# Patient Record
Sex: Female | Born: 1945 | Race: White | Hispanic: No | Marital: Single | Smoking: Former smoker
Health system: Southern US, Community
[De-identification: ages and names within clinical notes are randomized; demographics above are authoritative.]

## PROBLEM LIST (undated history)

## (undated) DIAGNOSIS — F32A Depression, unspecified: Secondary | ICD-10-CM

## (undated) DIAGNOSIS — G4733 Obstructive sleep apnea (adult) (pediatric): Secondary | ICD-10-CM

## (undated) DIAGNOSIS — I10 Essential (primary) hypertension: Secondary | ICD-10-CM

## (undated) DIAGNOSIS — F329 Major depressive disorder, single episode, unspecified: Secondary | ICD-10-CM

## (undated) DIAGNOSIS — Z79890 Hormone replacement therapy (postmenopausal): Secondary | ICD-10-CM

## (undated) DIAGNOSIS — E119 Type 2 diabetes mellitus without complications: Secondary | ICD-10-CM

## (undated) HISTORY — DX: Depression, unspecified: F32.A

## (undated) HISTORY — DX: Major depressive disorder, single episode, unspecified: F32.9

## (undated) HISTORY — DX: Hormone replacement therapy: Z79.890

## (undated) HISTORY — DX: Essential (primary) hypertension: I10

## (undated) HISTORY — DX: Obstructive sleep apnea (adult) (pediatric): G47.33

## (undated) HISTORY — DX: Type 2 diabetes mellitus without complications: E11.9

---

## 1999-01-24 ENCOUNTER — Other Ambulatory Visit: Admission: RE | Admit: 1999-01-24 | Discharge: 1999-01-24 | Payer: Self-pay | Admitting: Obstetrics and Gynecology

## 2000-02-22 ENCOUNTER — Other Ambulatory Visit: Admission: RE | Admit: 2000-02-22 | Discharge: 2000-02-22 | Payer: Self-pay | Admitting: Obstetrics and Gynecology

## 2001-02-22 ENCOUNTER — Other Ambulatory Visit: Admission: RE | Admit: 2001-02-22 | Discharge: 2001-02-22 | Payer: Self-pay | Admitting: Obstetrics and Gynecology

## 2002-02-25 ENCOUNTER — Other Ambulatory Visit: Admission: RE | Admit: 2002-02-25 | Discharge: 2002-02-25 | Payer: Self-pay | Admitting: Obstetrics and Gynecology

## 2003-02-27 ENCOUNTER — Other Ambulatory Visit: Admission: RE | Admit: 2003-02-27 | Discharge: 2003-02-27 | Payer: Self-pay | Admitting: Obstetrics and Gynecology

## 2004-03-02 ENCOUNTER — Other Ambulatory Visit: Admission: RE | Admit: 2004-03-02 | Discharge: 2004-03-02 | Payer: Self-pay | Admitting: Obstetrics and Gynecology

## 2005-03-06 ENCOUNTER — Other Ambulatory Visit: Admission: RE | Admit: 2005-03-06 | Discharge: 2005-03-06 | Payer: Self-pay | Admitting: Obstetrics and Gynecology

## 2006-03-13 ENCOUNTER — Other Ambulatory Visit: Admission: RE | Admit: 2006-03-13 | Discharge: 2006-03-13 | Payer: Self-pay | Admitting: Obstetrics and Gynecology

## 2007-03-19 ENCOUNTER — Other Ambulatory Visit: Admission: RE | Admit: 2007-03-19 | Discharge: 2007-03-19 | Payer: Self-pay | Admitting: Obstetrics and Gynecology

## 2008-03-19 ENCOUNTER — Other Ambulatory Visit: Admission: RE | Admit: 2008-03-19 | Discharge: 2008-03-19 | Payer: Self-pay | Admitting: Obstetrics and Gynecology

## 2009-04-20 ENCOUNTER — Encounter: Payer: Self-pay | Admitting: Obstetrics and Gynecology

## 2009-04-20 ENCOUNTER — Other Ambulatory Visit: Admission: RE | Admit: 2009-04-20 | Discharge: 2009-04-20 | Payer: Self-pay | Admitting: Obstetrics and Gynecology

## 2009-04-20 ENCOUNTER — Ambulatory Visit: Payer: Self-pay | Admitting: Obstetrics and Gynecology

## 2009-08-11 ENCOUNTER — Encounter: Admission: RE | Admit: 2009-08-11 | Discharge: 2009-08-11 | Payer: Self-pay | Admitting: Family Medicine

## 2010-04-24 DIAGNOSIS — E119 Type 2 diabetes mellitus without complications: Secondary | ICD-10-CM

## 2010-04-24 HISTORY — DX: Type 2 diabetes mellitus without complications: E11.9

## 2010-05-04 ENCOUNTER — Ambulatory Visit: Payer: Self-pay | Admitting: Obstetrics and Gynecology

## 2010-05-04 ENCOUNTER — Other Ambulatory Visit: Admission: RE | Admit: 2010-05-04 | Discharge: 2010-05-04 | Payer: Self-pay | Admitting: Obstetrics and Gynecology

## 2011-05-24 ENCOUNTER — Encounter (INDEPENDENT_AMBULATORY_CARE_PROVIDER_SITE_OTHER): Payer: Medicare Other | Admitting: Obstetrics and Gynecology

## 2011-05-24 DIAGNOSIS — N951 Menopausal and female climacteric states: Secondary | ICD-10-CM

## 2011-05-24 DIAGNOSIS — N952 Postmenopausal atrophic vaginitis: Secondary | ICD-10-CM

## 2011-05-24 DIAGNOSIS — R350 Frequency of micturition: Secondary | ICD-10-CM

## 2011-07-18 ENCOUNTER — Encounter: Payer: Self-pay | Admitting: Obstetrics and Gynecology

## 2012-01-08 DIAGNOSIS — E669 Obesity, unspecified: Secondary | ICD-10-CM | POA: Diagnosis not present

## 2012-01-08 DIAGNOSIS — G4733 Obstructive sleep apnea (adult) (pediatric): Secondary | ICD-10-CM | POA: Diagnosis not present

## 2012-01-08 DIAGNOSIS — I10 Essential (primary) hypertension: Secondary | ICD-10-CM | POA: Diagnosis not present

## 2012-01-22 DIAGNOSIS — E7800 Pure hypercholesterolemia, unspecified: Secondary | ICD-10-CM | POA: Diagnosis not present

## 2012-01-22 DIAGNOSIS — I10 Essential (primary) hypertension: Secondary | ICD-10-CM | POA: Diagnosis not present

## 2012-02-07 ENCOUNTER — Other Ambulatory Visit: Payer: Self-pay | Admitting: Family Medicine

## 2012-02-07 DIAGNOSIS — D2330 Other benign neoplasm of skin of unspecified part of face: Secondary | ICD-10-CM | POA: Diagnosis not present

## 2012-04-22 DIAGNOSIS — E7800 Pure hypercholesterolemia, unspecified: Secondary | ICD-10-CM | POA: Diagnosis not present

## 2012-07-10 DIAGNOSIS — I10 Essential (primary) hypertension: Secondary | ICD-10-CM | POA: Diagnosis not present

## 2012-07-10 DIAGNOSIS — E669 Obesity, unspecified: Secondary | ICD-10-CM | POA: Diagnosis not present

## 2012-07-10 DIAGNOSIS — G4733 Obstructive sleep apnea (adult) (pediatric): Secondary | ICD-10-CM | POA: Diagnosis not present

## 2012-08-12 DIAGNOSIS — Z23 Encounter for immunization: Secondary | ICD-10-CM | POA: Diagnosis not present

## 2012-08-12 DIAGNOSIS — G4733 Obstructive sleep apnea (adult) (pediatric): Secondary | ICD-10-CM | POA: Diagnosis not present

## 2012-08-12 DIAGNOSIS — E7800 Pure hypercholesterolemia, unspecified: Secondary | ICD-10-CM | POA: Diagnosis not present

## 2012-08-12 DIAGNOSIS — F325 Major depressive disorder, single episode, in full remission: Secondary | ICD-10-CM | POA: Diagnosis not present

## 2012-08-12 DIAGNOSIS — I10 Essential (primary) hypertension: Secondary | ICD-10-CM | POA: Diagnosis not present

## 2012-08-12 DIAGNOSIS — Z0000 Encounter for general adult medical examination without abnormal findings: Secondary | ICD-10-CM | POA: Diagnosis not present

## 2012-08-12 DIAGNOSIS — J301 Allergic rhinitis due to pollen: Secondary | ICD-10-CM | POA: Diagnosis not present

## 2012-09-25 ENCOUNTER — Encounter: Payer: Self-pay | Admitting: Obstetrics and Gynecology

## 2012-09-25 DIAGNOSIS — Z1231 Encounter for screening mammogram for malignant neoplasm of breast: Secondary | ICD-10-CM | POA: Diagnosis not present

## 2012-09-25 DIAGNOSIS — Z13820 Encounter for screening for osteoporosis: Secondary | ICD-10-CM | POA: Diagnosis not present

## 2012-09-25 DIAGNOSIS — N951 Menopausal and female climacteric states: Secondary | ICD-10-CM | POA: Diagnosis not present

## 2012-10-08 DIAGNOSIS — Z23 Encounter for immunization: Secondary | ICD-10-CM | POA: Diagnosis not present

## 2012-10-08 DIAGNOSIS — M25569 Pain in unspecified knee: Secondary | ICD-10-CM | POA: Diagnosis not present

## 2012-10-11 DIAGNOSIS — M25569 Pain in unspecified knee: Secondary | ICD-10-CM | POA: Diagnosis not present

## 2012-10-11 DIAGNOSIS — M25469 Effusion, unspecified knee: Secondary | ICD-10-CM | POA: Diagnosis not present

## 2013-02-12 DIAGNOSIS — E669 Obesity, unspecified: Secondary | ICD-10-CM | POA: Diagnosis not present

## 2013-02-12 DIAGNOSIS — F325 Major depressive disorder, single episode, in full remission: Secondary | ICD-10-CM | POA: Diagnosis not present

## 2013-02-12 DIAGNOSIS — I10 Essential (primary) hypertension: Secondary | ICD-10-CM | POA: Diagnosis not present

## 2013-02-12 DIAGNOSIS — G4733 Obstructive sleep apnea (adult) (pediatric): Secondary | ICD-10-CM | POA: Diagnosis not present

## 2013-02-12 DIAGNOSIS — E7800 Pure hypercholesterolemia, unspecified: Secondary | ICD-10-CM | POA: Diagnosis not present

## 2013-06-09 DIAGNOSIS — E119 Type 2 diabetes mellitus without complications: Secondary | ICD-10-CM | POA: Diagnosis not present

## 2013-06-09 DIAGNOSIS — E7800 Pure hypercholesterolemia, unspecified: Secondary | ICD-10-CM | POA: Diagnosis not present

## 2013-08-26 DIAGNOSIS — G4733 Obstructive sleep apnea (adult) (pediatric): Secondary | ICD-10-CM | POA: Diagnosis not present

## 2013-08-26 DIAGNOSIS — E669 Obesity, unspecified: Secondary | ICD-10-CM | POA: Diagnosis not present

## 2013-08-26 DIAGNOSIS — I10 Essential (primary) hypertension: Secondary | ICD-10-CM | POA: Diagnosis not present

## 2013-10-21 DIAGNOSIS — E669 Obesity, unspecified: Secondary | ICD-10-CM | POA: Diagnosis not present

## 2013-10-21 DIAGNOSIS — I10 Essential (primary) hypertension: Secondary | ICD-10-CM | POA: Diagnosis not present

## 2013-10-21 DIAGNOSIS — IMO0002 Reserved for concepts with insufficient information to code with codable children: Secondary | ICD-10-CM | POA: Diagnosis not present

## 2013-10-21 DIAGNOSIS — G4733 Obstructive sleep apnea (adult) (pediatric): Secondary | ICD-10-CM | POA: Diagnosis not present

## 2013-10-21 DIAGNOSIS — Z23 Encounter for immunization: Secondary | ICD-10-CM | POA: Diagnosis not present

## 2013-10-21 DIAGNOSIS — Z0000 Encounter for general adult medical examination without abnormal findings: Secondary | ICD-10-CM | POA: Diagnosis not present

## 2013-10-21 DIAGNOSIS — Z1231 Encounter for screening mammogram for malignant neoplasm of breast: Secondary | ICD-10-CM | POA: Diagnosis not present

## 2013-10-21 DIAGNOSIS — E7800 Pure hypercholesterolemia, unspecified: Secondary | ICD-10-CM | POA: Diagnosis not present

## 2013-10-21 DIAGNOSIS — J301 Allergic rhinitis due to pollen: Secondary | ICD-10-CM | POA: Diagnosis not present

## 2013-12-24 DIAGNOSIS — H02839 Dermatochalasis of unspecified eye, unspecified eyelid: Secondary | ICD-10-CM | POA: Diagnosis not present

## 2013-12-24 DIAGNOSIS — H04129 Dry eye syndrome of unspecified lacrimal gland: Secondary | ICD-10-CM | POA: Diagnosis not present

## 2013-12-24 DIAGNOSIS — H02429 Myogenic ptosis of unspecified eyelid: Secondary | ICD-10-CM | POA: Diagnosis not present

## 2013-12-24 DIAGNOSIS — H5340 Unspecified visual field defects: Secondary | ICD-10-CM | POA: Diagnosis not present

## 2013-12-24 DIAGNOSIS — H02409 Unspecified ptosis of unspecified eyelid: Secondary | ICD-10-CM | POA: Diagnosis not present

## 2014-02-16 DIAGNOSIS — H02839 Dermatochalasis of unspecified eye, unspecified eyelid: Secondary | ICD-10-CM | POA: Diagnosis not present

## 2014-02-16 DIAGNOSIS — H02429 Myogenic ptosis of unspecified eyelid: Secondary | ICD-10-CM | POA: Diagnosis not present

## 2014-02-16 DIAGNOSIS — Z79899 Other long term (current) drug therapy: Secondary | ICD-10-CM | POA: Diagnosis not present

## 2014-02-16 DIAGNOSIS — E119 Type 2 diabetes mellitus without complications: Secondary | ICD-10-CM | POA: Diagnosis not present

## 2014-02-16 DIAGNOSIS — IMO0002 Reserved for concepts with insufficient information to code with codable children: Secondary | ICD-10-CM | POA: Diagnosis not present

## 2014-02-16 DIAGNOSIS — H02409 Unspecified ptosis of unspecified eyelid: Secondary | ICD-10-CM | POA: Diagnosis not present

## 2014-02-16 DIAGNOSIS — Z7982 Long term (current) use of aspirin: Secondary | ICD-10-CM | POA: Diagnosis not present

## 2014-02-16 DIAGNOSIS — I10 Essential (primary) hypertension: Secondary | ICD-10-CM | POA: Diagnosis not present

## 2014-02-16 DIAGNOSIS — H04129 Dry eye syndrome of unspecified lacrimal gland: Secondary | ICD-10-CM | POA: Diagnosis not present

## 2014-02-16 DIAGNOSIS — L918 Other hypertrophic disorders of the skin: Secondary | ICD-10-CM | POA: Diagnosis not present

## 2014-02-16 DIAGNOSIS — H539 Unspecified visual disturbance: Secondary | ICD-10-CM | POA: Diagnosis not present

## 2014-02-16 DIAGNOSIS — L908 Other atrophic disorders of skin: Secondary | ICD-10-CM | POA: Diagnosis not present

## 2014-02-16 DIAGNOSIS — H5340 Unspecified visual field defects: Secondary | ICD-10-CM | POA: Diagnosis not present

## 2014-03-02 ENCOUNTER — Encounter: Payer: Self-pay | Admitting: General Surgery

## 2014-03-02 DIAGNOSIS — G4733 Obstructive sleep apnea (adult) (pediatric): Secondary | ICD-10-CM | POA: Insufficient documentation

## 2014-03-02 DIAGNOSIS — I10 Essential (primary) hypertension: Secondary | ICD-10-CM

## 2014-03-10 ENCOUNTER — Ambulatory Visit (INDEPENDENT_AMBULATORY_CARE_PROVIDER_SITE_OTHER): Payer: Medicare Other | Admitting: Cardiology

## 2014-03-10 ENCOUNTER — Encounter: Payer: Self-pay | Admitting: Cardiology

## 2014-03-10 VITALS — BP 144/83 | HR 67 | Ht 66.0 in | Wt 227.0 lb

## 2014-03-10 DIAGNOSIS — I10 Essential (primary) hypertension: Secondary | ICD-10-CM | POA: Diagnosis not present

## 2014-03-10 DIAGNOSIS — E669 Obesity, unspecified: Secondary | ICD-10-CM

## 2014-03-10 DIAGNOSIS — G4733 Obstructive sleep apnea (adult) (pediatric): Secondary | ICD-10-CM | POA: Diagnosis not present

## 2014-03-10 NOTE — Patient Instructions (Addendum)
Your physician recommends that you continue on your current medications as directed. Please refer to the Current Medication list given to you today.  We are going to have a 2 Week AutoTitration done with [facility] for you.  Your physician wants you to follow-up in: 6 months with Dr Mallie Snooks will receive a reminder letter in the mail two months in advance. If you don't receive a letter, please call our office to schedule the follow-up appointment.

## 2014-03-10 NOTE — Progress Notes (Signed)
  [place], [place], [place]  [address] Phone: [phone] Fax:  [phone]  Date:  03/10/2014   ID:  Angela, Heath May 25, 1946, MRN [id number]  PCP:  Osborne Casco, MD  Sleep Medicine:    Fransico Him, MD   History of Present Illness: Angela Heath is a 68 y.o. female with a history of OSA, HTN and obesity who presents today for followup.  She is doing well.  She tolerates her CPAP device and feels the pressure is adequate.  She uses a nasal mask with chin strap and tolerates it well.   Wt Readings from Last 3 Encounters:  03/10/14 227 lb (102.967 kg)     Past Medical History  Diagnosis Date  . Diabetes 04/2010  . Hypertension   . Hormone replacement therapy   . OSA (obstructive sleep apnea)   . Depression     with anxious features    Current Outpatient Prescriptions  Medication Sig Dispense Refill  . aspirin EC 81 MG tablet Take 81 mg by mouth daily.      . citalopram (CELEXA) 10 MG tablet Take 10 mg by mouth daily.      Marland Kitchen glimepiride (AMARYL) 4 MG tablet Take 4 mg by mouth daily with breakfast.      . losartan-hydrochlorothiazide (HYZAAR) 100-12.5 MG per tablet Take 1 tablet by mouth daily.      . metFORMIN (GLUCOPHAGE) 1000 MG tablet Take 1,000 mg by mouth daily with breakfast.      . montelukast (SINGULAIR) 10 MG tablet Take 10 mg by mouth at bedtime.      . Omega-3 Fatty Acids (FISH OIL) 1000 MG CAPS Take 1 capsule by mouth 2 (two) times daily.       No current facility-administered medications for this visit.    Allergies:   No Known Allergies  Social History:  The patient  reports that she quit smoking about 19 years ago. She does not have any smokeless tobacco history on file. She reports that she does not drink alcohol or use illicit drugs.   Family History:  The patient's family history includes CVA in her father; Diabetes in her mother; Hypertension in her father.   ROS:  Please see the history of present illness.       All other systems reviewed and negative.   PHYSICAL EXAM: VS:  BP 144/83  Pulse 67  Ht 5\' 6"  (1.676 m)  Wt 227 lb (102.967 kg)  BMI 36.66 kg/m2 Well nourished, well developed, in no acute distress HEENT: normal Neck: no JVD Cardiac:  normal S1, S2; RRR; no murmur Lungs:  clear to auscultation bilaterally, no wheezing, rhonchi or rales Abd: soft, nontender, no hepatomegaly Ext: no edema Skin: warm and dry Neuro:  CNs 2-12 intact, no focal abnormalities noted       ASSESSMENT AND PLAN:  1. OSA on CPAP and tolerating well.  Her download today showed an AHI of 7.8/hr on 10cm H2o and 84% compliance in using more than 4 hours.  I will order a 2 week autotitration from 4 to 20cm H2O given her elevated AHI. 2. HTN - controlled  - continue Hyzaar 3. Obesity - She walks on a treadmill for 45 minutes about 3 days weekly.  I encouraged her to try to get up to 5 days weekly.  Signed, Fransico Him, MD 03/10/2014 9:31 AM

## 2014-03-25 ENCOUNTER — Encounter: Payer: Self-pay | Admitting: Cardiology

## 2014-04-21 DIAGNOSIS — I10 Essential (primary) hypertension: Secondary | ICD-10-CM | POA: Diagnosis not present

## 2014-04-21 DIAGNOSIS — E7800 Pure hypercholesterolemia, unspecified: Secondary | ICD-10-CM | POA: Diagnosis not present

## 2014-04-21 DIAGNOSIS — IMO0001 Reserved for inherently not codable concepts without codable children: Secondary | ICD-10-CM | POA: Diagnosis not present

## 2014-04-21 DIAGNOSIS — IMO0002 Reserved for concepts with insufficient information to code with codable children: Secondary | ICD-10-CM | POA: Diagnosis not present

## 2014-07-21 DIAGNOSIS — IMO0001 Reserved for inherently not codable concepts without codable children: Secondary | ICD-10-CM | POA: Diagnosis not present

## 2014-09-14 ENCOUNTER — Encounter: Payer: Self-pay | Admitting: Cardiology

## 2014-09-15 ENCOUNTER — Encounter: Payer: Self-pay | Admitting: Cardiology

## 2014-09-15 ENCOUNTER — Ambulatory Visit (INDEPENDENT_AMBULATORY_CARE_PROVIDER_SITE_OTHER): Payer: Medicare Other | Admitting: Cardiology

## 2014-09-15 VITALS — BP 124/62 | HR 71 | Ht 66.0 in | Wt 223.0 lb

## 2014-09-15 DIAGNOSIS — G4733 Obstructive sleep apnea (adult) (pediatric): Secondary | ICD-10-CM

## 2014-09-15 DIAGNOSIS — E669 Obesity, unspecified: Secondary | ICD-10-CM | POA: Diagnosis not present

## 2014-09-15 DIAGNOSIS — I10 Essential (primary) hypertension: Secondary | ICD-10-CM | POA: Diagnosis not present

## 2014-09-15 NOTE — Patient Instructions (Signed)
Your physician recommends that you continue on your current medications as directed. Please refer to the Current Medication list given to you today.  Your physician wants you to follow-up in: 6 months with Dr Turner You will receive a reminder letter in the mail two months in advance. If you don't receive a letter, please call our office to schedule the follow-up appointment.  

## 2014-09-15 NOTE — Progress Notes (Signed)
[place], [place], [place]  [address] Phone: [phone] Fax:  [phone]  Date:  09/15/2014   ID:  Angela, Heath November 28, 1946, MRN [id number]  PCP:  Osborne Casco, MD  Cardiologist:  Fransico Him, MD    History of Present Illness: Angela Heath is a 68 y.o. female with a history of OSA, HTN and obesity who presents today for followup. She is doing well. She tolerates her CPAP device and feels the pressure is adequate. She uses a nasal mask with chin strap and tolerates it well. She has not had any nasal congestion or dry eyes in the am.  She feels rested when she gets up but by afternoon she gets sleepy and has to lay down. She goes to bed at 10:30pm and get up at 7am and does not wake up during the night.    Wt Readings from Last 3 Encounters:  09/15/14 223 lb (101.152 kg)  03/10/14 227 lb (102.967 kg)     Past Medical History  Diagnosis Date  . Diabetes 04/2010  . Hypertension   . Hormone replacement therapy   . OSA (obstructive sleep apnea)   . Depression     with anxious features    Current Outpatient Prescriptions  Medication Sig Dispense Refill  . aspirin EC 81 MG tablet Take 81 mg by mouth daily.      . citalopram (CELEXA) 10 MG tablet Take 10 mg by mouth daily.      Marland Kitchen glimepiride (AMARYL) 4 MG tablet Take 4 mg by mouth daily with breakfast.      . losartan-hydrochlorothiazide (HYZAAR) 100-12.5 MG per tablet Take 1 tablet by mouth daily.      . metFORMIN (GLUCOPHAGE) 1000 MG tablet Take 1,000 mg by mouth 2 (two) times daily with a meal.       . montelukast (SINGULAIR) 10 MG tablet Take 10 mg by mouth at bedtime.      . Omega-3 Fatty Acids (FISH OIL) 1000 MG CAPS Take 1 capsule by mouth 2 (two) times daily.       No current facility-administered medications for this visit.    Allergies:   No Known Allergies  Social History:  The patient  reports that she quit smoking about 19 years ago. She does not have any smokeless tobacco  history on file. She reports that she does not drink alcohol or use illicit drugs.   Family History:  The patient's family history includes CVA in her father; Diabetes in her mother; Hypertension in her father.   ROS:  Please see the history of present illness.      All other systems reviewed and negative.   PHYSICAL EXAM: VS:  BP 124/62  Pulse 71  Ht 5\' 6"  (1.676 m)  Wt 223 lb (101.152 kg)  BMI 36.01 kg/m2 Well nourished, well developed, in no acute distress HEENT: normal Neck: no JVD Cardiac:  normal S1, S2; RRR; no murmur Lungs:  clear to auscultation bilaterally, no wheezing, rhonchi or rales Abd: soft, nontender, no hepatomegaly Ext: no edema Skin: warm and dry Neuro:  CNs 2-12 intact, no focal abnormalities noted  ASSESSMENT AND PLAN:  1. OSA on CPAP and tolerating well. Her download today showed an AHI of 7/hr on 10cm H20 and 82% compliance in using more than 4 hours. She is having some daytime sleepiness and AHI is elevated.  She was supposed to have a 2 week autotitration last time I saw her but it was  not done.  I will order another one. 2. HTN - controlled - continue Hyzaar  3. Obesity - She walks on a treadmill for 45 minutes about 3 days weekly. I encouraged her to try to get up to 5 days weekly.   Signed, Fransico Him, MD [facility] 09/15/2014 9:28 AM

## 2014-09-23 ENCOUNTER — Telehealth: Payer: Self-pay | Admitting: Cardiology

## 2014-09-23 NOTE — Telephone Encounter (Signed)
New Message  Pt called to discuss having a follow up on her CPAP machine.. Has not received a call back from [facility] to adjust the pressure on the CPAP machine. Please call back to discuss.Marland Kitchen

## 2014-09-23 NOTE — Telephone Encounter (Signed)
Spoke with Art [employer] manager. The number on file they had was incorrect. I gave them the correct number and they stated they would call her today.

## 2014-10-27 DIAGNOSIS — G4733 Obstructive sleep apnea (adult) (pediatric): Secondary | ICD-10-CM | POA: Diagnosis not present

## 2014-10-27 DIAGNOSIS — E1165 Type 2 diabetes mellitus with hyperglycemia: Secondary | ICD-10-CM | POA: Diagnosis not present

## 2014-10-27 DIAGNOSIS — E780 Pure hypercholesterolemia: Secondary | ICD-10-CM | POA: Diagnosis not present

## 2014-10-27 DIAGNOSIS — Z6837 Body mass index (BMI) 37.0-37.9, adult: Secondary | ICD-10-CM | POA: Diagnosis not present

## 2014-10-27 DIAGNOSIS — F324 Major depressive disorder, single episode, in partial remission: Secondary | ICD-10-CM | POA: Diagnosis not present

## 2014-10-27 DIAGNOSIS — E669 Obesity, unspecified: Secondary | ICD-10-CM | POA: Diagnosis not present

## 2014-10-27 DIAGNOSIS — J301 Allergic rhinitis due to pollen: Secondary | ICD-10-CM | POA: Diagnosis not present

## 2014-10-27 DIAGNOSIS — Z1389 Encounter for screening for other disorder: Secondary | ICD-10-CM | POA: Diagnosis not present

## 2014-10-27 DIAGNOSIS — I10 Essential (primary) hypertension: Secondary | ICD-10-CM | POA: Diagnosis not present

## 2014-10-27 DIAGNOSIS — Z0000 Encounter for general adult medical examination without abnormal findings: Secondary | ICD-10-CM | POA: Diagnosis not present

## 2014-11-03 DIAGNOSIS — Z1231 Encounter for screening mammogram for malignant neoplasm of breast: Secondary | ICD-10-CM | POA: Diagnosis not present

## 2015-01-01 ENCOUNTER — Encounter: Payer: Self-pay | Admitting: Cardiology

## 2015-02-02 DIAGNOSIS — J329 Chronic sinusitis, unspecified: Secondary | ICD-10-CM | POA: Diagnosis not present

## 2015-03-16 NOTE — Progress Notes (Signed)
Cardiology Office Note   Date:  03/17/2015   ID:  Zahari, Fazzino 1946-09-27, MRN [id number]  PCP:  Osborne Casco, MD  Sleep Medicine:   Sueanne Margarita, MD   Chief Complaint  Patient presents with  . Sleep Apnea  . Hypertension      History of Present Illness: Angela Heath is a 69 y.o. female with a history of OSA, HTN and obesity who presents today for followup. She is doing wel but had some problems with the flu and sinus infections the past few months and has not used it much.   She tolerates her CPAP device and feels the pressure is adequate. She uses a nasal mask with chin strap and tolerates it well. She has not had any nasal congestion or dry eyes in the am. She feels rested when she gets up but by afternoon she gets sleepy and has to lay down. She walks on the treadmill 2-3 days weekly for 20-30 minutes.      Past Medical History  Diagnosis Date  . Diabetes 04/2010  . Hypertension   . Hormone replacement therapy   . OSA (obstructive sleep apnea)   . Depression     with anxious features    No past surgical history on file.   Current Outpatient Prescriptions  Medication Sig Dispense Refill  . aspirin EC 81 MG tablet Take 81 mg by mouth daily.    . citalopram (CELEXA) 20 MG tablet Take 20 mg by mouth daily.  0  . glimepiride (AMARYL) 4 MG tablet Take 4 mg by mouth daily with breakfast.    . losartan-hydrochlorothiazide (HYZAAR) 100-25 MG per tablet Take 1 tablet by mouth daily.  1  . metFORMIN (GLUCOPHAGE) 1000 MG tablet Take 1,000 mg by mouth 2 (two) times daily with a meal.     . montelukast (SINGULAIR) 10 MG tablet Take 10 mg by mouth at bedtime.    . Omega-3 Fatty Acids (FISH OIL) 1000 MG CAPS Take 1 capsule by mouth 2 (two) times daily.    . ONE TOUCH ULTRA TEST test strip daily. for testing  0  . ONETOUCH DELICA LANCETS 85I MISC   0   No current facility-administered medications for this visit.    Allergies:   Review of patient's  allergies indicates no known allergies.    Social History:  The patient  reports that she quit smoking about 20 years ago. She does not have any smokeless tobacco history on file. She reports that she does not drink alcohol or use illicit drugs.   Family History:  The patient's family history includes CVA in her father; Diabetes in her mother; Hypertension in her father.    ROS:  Please see the history of present illness.   Otherwise, review of systems are positive for none.   All other systems are reviewed and negative.    PHYSICAL EXAM: VS:  BP 112/62 mmHg  Pulse 68  Ht 5\' 6"  (1.676 m)  Wt 213 lb 1.9 oz (96.671 kg)  BMI 34.42 kg/m2 , BMI Body mass index is 34.42 kg/(m^2). GEN: Well nourished, well developed, in no acute distress HEENT: normal Neck: no JVD, carotid bruits, or masses Cardiac: RRR; no murmurs, rubs, or gallops,no edema  Respiratory:  clear to auscultation bilaterally, normal work of breathing GI: soft, nontender, nondistended, + BS MS: no deformity or atrophy Skin: warm and dry, no rash Neuro:  Strength and sensation are intact Psych: euthymic mood, full affect  EKG:  EKG is not ordered today.    Recent Labs: No results found for requested labs within last 365 days.    Lipid Panel No results found for: CHOL, TRIG, HDL, CHOLHDL, VLDL, LDLCALC, LDLDIRECT    Wt Readings from Last 3 Encounters:  03/17/15 213 lb 1.9 oz (96.671 kg)  09/15/14 223 lb (101.152 kg)  03/10/14 227 lb (102.967 kg)     ASSESSMENT AND PLAN:  1. OSA on CPAP and tolerating well. I will get a download from her DME 2. HTN - controlled - continue Hyzaar        3.   Obesity - She walks on a treadmill for 45 minutes about 3 days weekly.  Current medicines are reviewed at length with the patient today.    The patient does not have concerns regarding medicines.  The following changes have been made:  no change  Labs/ tests ordered today include:  No orders of the defined types were  placed in this encounter.     Disposition:   FU with me in 6 months   Signed, Sueanne Margarita, MD  03/17/2015 9:03 AM    [facility] [place], [place], [place]  [address] Phone: [phone]; Fax: [phone]

## 2015-03-17 ENCOUNTER — Ambulatory Visit (INDEPENDENT_AMBULATORY_CARE_PROVIDER_SITE_OTHER): Payer: Medicare Other | Admitting: Cardiology

## 2015-03-17 ENCOUNTER — Encounter: Payer: Self-pay | Admitting: Cardiology

## 2015-03-17 VITALS — BP 112/62 | HR 68 | Ht 66.0 in | Wt 213.1 lb

## 2015-03-17 DIAGNOSIS — E669 Obesity, unspecified: Secondary | ICD-10-CM

## 2015-03-17 DIAGNOSIS — G4733 Obstructive sleep apnea (adult) (pediatric): Secondary | ICD-10-CM

## 2015-03-17 DIAGNOSIS — I10 Essential (primary) hypertension: Secondary | ICD-10-CM | POA: Diagnosis not present

## 2015-03-17 NOTE — Patient Instructions (Signed)
Please take your card to your Medical Supplier and have them send [place] a download.  Your physician wants you to follow-up in: 6 months with Dr. Radford Pax. You will receive a reminder letter in the mail two months in advance. If you don't receive a letter, please call our office to schedule the follow-up appointment.

## 2015-04-27 DIAGNOSIS — G4733 Obstructive sleep apnea (adult) (pediatric): Secondary | ICD-10-CM | POA: Diagnosis not present

## 2015-04-27 DIAGNOSIS — E780 Pure hypercholesterolemia: Secondary | ICD-10-CM | POA: Diagnosis not present

## 2015-04-27 DIAGNOSIS — E119 Type 2 diabetes mellitus without complications: Secondary | ICD-10-CM | POA: Diagnosis not present

## 2015-04-27 DIAGNOSIS — I10 Essential (primary) hypertension: Secondary | ICD-10-CM | POA: Diagnosis not present

## 2015-07-01 ENCOUNTER — Encounter: Payer: Self-pay | Admitting: Cardiology

## 2015-09-20 ENCOUNTER — Ambulatory Visit: Payer: Medicare Other | Admitting: Cardiology

## 2015-10-12 ENCOUNTER — Ambulatory Visit: Payer: Medicare Other | Admitting: Cardiology

## 2015-11-01 DIAGNOSIS — G4733 Obstructive sleep apnea (adult) (pediatric): Secondary | ICD-10-CM | POA: Diagnosis not present

## 2015-11-01 DIAGNOSIS — Z1389 Encounter for screening for other disorder: Secondary | ICD-10-CM | POA: Diagnosis not present

## 2015-11-01 DIAGNOSIS — Z6835 Body mass index (BMI) 35.0-35.9, adult: Secondary | ICD-10-CM | POA: Diagnosis not present

## 2015-11-01 DIAGNOSIS — Z0000 Encounter for general adult medical examination without abnormal findings: Secondary | ICD-10-CM | POA: Diagnosis not present

## 2015-11-01 DIAGNOSIS — Z23 Encounter for immunization: Secondary | ICD-10-CM | POA: Diagnosis not present

## 2015-11-01 DIAGNOSIS — E7800 Pure hypercholesterolemia, unspecified: Secondary | ICD-10-CM | POA: Diagnosis not present

## 2015-11-01 DIAGNOSIS — F324 Major depressive disorder, single episode, in partial remission: Secondary | ICD-10-CM | POA: Diagnosis not present

## 2015-11-01 DIAGNOSIS — E119 Type 2 diabetes mellitus without complications: Secondary | ICD-10-CM | POA: Diagnosis not present

## 2015-11-01 DIAGNOSIS — I10 Essential (primary) hypertension: Secondary | ICD-10-CM | POA: Diagnosis not present

## 2015-11-01 DIAGNOSIS — E669 Obesity, unspecified: Secondary | ICD-10-CM | POA: Diagnosis not present

## 2015-11-01 DIAGNOSIS — J301 Allergic rhinitis due to pollen: Secondary | ICD-10-CM | POA: Diagnosis not present

## 2015-11-08 DIAGNOSIS — Z1231 Encounter for screening mammogram for malignant neoplasm of breast: Secondary | ICD-10-CM | POA: Diagnosis not present

## 2015-12-03 ENCOUNTER — Ambulatory Visit (INDEPENDENT_AMBULATORY_CARE_PROVIDER_SITE_OTHER): Payer: Medicare Other | Admitting: Cardiology

## 2015-12-03 ENCOUNTER — Encounter: Payer: Self-pay | Admitting: Cardiology

## 2015-12-03 VITALS — BP 130/80 | HR 76 | Ht 65.0 in | Wt 216.8 lb

## 2015-12-03 DIAGNOSIS — I10 Essential (primary) hypertension: Secondary | ICD-10-CM

## 2015-12-03 DIAGNOSIS — G4733 Obstructive sleep apnea (adult) (pediatric): Secondary | ICD-10-CM | POA: Diagnosis not present

## 2015-12-03 DIAGNOSIS — E669 Obesity, unspecified: Secondary | ICD-10-CM

## 2015-12-03 NOTE — Patient Instructions (Signed)

## 2015-12-03 NOTE — Progress Notes (Signed)
Cardiology Office Note   Date:  12/03/2015   ID:  Angela Heath, Angela Heath 1946-07-17, MRN [id number]  PCP:  Osborne Casco, MD    Chief Complaint  Patient presents with  . Sleep Apnea  . Hypertension      History of Present Illness: Angela Heath is a 69 y.o. female with a history of OSA, HTN and obesity who presents today for followup.  She tolerates her CPAP device and feels the pressure is adequate. She uses a nasal mask with chin strap and tolerates it well. She feels rested when she gets up and has no daytime sleepienss.  She has not mouth or nasal dryness or nasal congestion.  She walks on the treadmill 2-3 days weekly for 20-30 minutes.    Past Medical History  Diagnosis Date  . Diabetes ([place]) 04/2010  . Hypertension   . Hormone replacement therapy   . OSA (obstructive sleep apnea)   . Depression     with anxious features    No past surgical history on file.   Current Outpatient Prescriptions  Medication Sig Dispense Refill  . aspirin EC 81 MG tablet Take 81 mg by mouth daily.    . citalopram (CELEXA) 20 MG tablet Take 20 mg by mouth daily.  0  . glimepiride (AMARYL) 4 MG tablet Take 4 mg by mouth daily with breakfast.    . losartan-hydrochlorothiazide (HYZAAR) 100-25 MG per tablet Take 1 tablet by mouth daily.  1  . metFORMIN (GLUCOPHAGE) 1000 MG tablet Take 1,000 mg by mouth 2 (two) times daily with a meal.     . montelukast (SINGULAIR) 10 MG tablet Take 10 mg by mouth at bedtime.    . Omega-3 Fatty Acids (FISH OIL) 1000 MG CAPS Take 1 capsule by mouth 2 (two) times daily.     No current facility-administered medications for this visit.    Allergies:   Review of patient's allergies indicates no known allergies.    Social History:  The patient  reports that she quit smoking about 20 years ago. She does not have any smokeless tobacco history on file. She reports that she does not drink alcohol or use illicit drugs.   Family  History:  The patient's family history includes CVA in her father; Diabetes in her mother; Hypertension in her father.    ROS:  Please see the history of present illness.   Otherwise, review of systems are positive for none.   All other systems are reviewed and negative.    PHYSICAL EXAM: VS:  BP 130/80 mmHg  Pulse 76  Ht 5\' 5"  (1.651 m)  Wt 216 lb 12.8 oz (98.34 kg)  BMI 36.08 kg/m2  SpO2 96% , BMI Body mass index is 36.08 kg/(m^2). GEN: Well nourished, well developed, in no acute distress HEENT: normal Neck: no JVD, carotid bruits, or masses Cardiac: RRR; no murmurs, rubs, or gallops,no edema  Respiratory:  clear to auscultation bilaterally, normal work of breathing GI: soft, nontender, nondistended, + BS MS: no deformity or atrophy Skin: warm and dry, no rash Neuro:  Strength and sensation are intact Psych: euthymic mood, full affect   EKG:  EKG is not ordered today.    Recent Labs: No results found for requested labs within last 365 days.    Lipid Panel No results found for: CHOL, TRIG, HDL, CHOLHDL, VLDL, LDLCALC, LDLDIRECT    Wt Readings from Last  3 Encounters:  12/03/15 216 lb 12.8 oz (98.34 kg)  03/17/15 213 lb 1.9 oz (96.671 kg)  09/15/14 223 lb (101.152 kg)     ASSESSMENT AND PLAN:  1. OSA on CPAP and tolerating well. D/L today showed an AHI of 5.6/hr on 10cm H2O and 82% compliance in using more than 4 hours nightly. 2. HTN - controlled - continue Hyzaar   3. Obesity - She walks on a treadmill for 30 minutes about 3 days weekly.  Current medicines are reviewed at length with the patient today.    Current medicines are reviewed at length with the patient today.  The patient does not have concerns regarding medicines.  The following changes have been made:  no change  Labs/ tests ordered today: See above Assessment and Plan No orders of the defined types were placed in this encounter.     Disposition:   FU with me in 1  year  Signed, Sueanne Margarita, MD  12/03/2015 8:57 AM    [facility] [place], [place], [place]  [address] Phone: [phone]; Fax: [phone]

## 2015-12-07 ENCOUNTER — Encounter: Payer: Self-pay | Admitting: Cardiology

## 2016-05-01 DIAGNOSIS — E7800 Pure hypercholesterolemia, unspecified: Secondary | ICD-10-CM | POA: Diagnosis not present

## 2016-05-01 DIAGNOSIS — E119 Type 2 diabetes mellitus without complications: Secondary | ICD-10-CM | POA: Diagnosis not present

## 2016-05-01 DIAGNOSIS — I10 Essential (primary) hypertension: Secondary | ICD-10-CM | POA: Diagnosis not present

## 2016-11-09 DIAGNOSIS — Z1231 Encounter for screening mammogram for malignant neoplasm of breast: Secondary | ICD-10-CM | POA: Diagnosis not present

## 2016-11-23 DIAGNOSIS — Z23 Encounter for immunization: Secondary | ICD-10-CM | POA: Diagnosis not present

## 2016-12-01 ENCOUNTER — Encounter: Payer: Self-pay | Admitting: Cardiology

## 2016-12-07 ENCOUNTER — Encounter: Payer: Self-pay | Admitting: Cardiology

## 2016-12-07 ENCOUNTER — Ambulatory Visit (INDEPENDENT_AMBULATORY_CARE_PROVIDER_SITE_OTHER): Payer: Medicare Other | Admitting: Cardiology

## 2016-12-07 VITALS — BP 140/74 | HR 68 | Ht 66.0 in | Wt 218.4 lb

## 2016-12-07 DIAGNOSIS — E669 Obesity, unspecified: Secondary | ICD-10-CM

## 2016-12-07 DIAGNOSIS — I10 Essential (primary) hypertension: Secondary | ICD-10-CM

## 2016-12-07 DIAGNOSIS — G4733 Obstructive sleep apnea (adult) (pediatric): Secondary | ICD-10-CM

## 2016-12-07 NOTE — Progress Notes (Signed)
Cardiology Office Note    Date:  12/07/2016   ID:  Angela Heath, DOB [birth year], MRN [id number]  PCP:  Osborne Casco, MD  Cardiologist:  Fransico Him, MD   Chief Complaint  Patient presents with  . Sleep Apnea  . Hypertension    History of Present Illness:  Angela Heath is a 70 y.o. female with a history of OSA, HTN and obesity who presents today for followup.  She tolerates her CPAP device and feels the pressure is adequate. She uses a nasal mask with chin strap and tolerates it well. She feels rested when she gets up and has no daytime sleepienss.  She denies any significant mouth or nasal dryness but occasionally has some nasal congestion with allergies.  Her exercise has dropped off some over the past few months due to the cold weather.  Past Medical History:  Diagnosis Date  . Depression    with anxious features  . Diabetes ([place]) 04/2010  . Hormone replacement therapy   . Hypertension   . OSA (obstructive sleep apnea)     History reviewed. No pertinent surgical history.  Current Medications: Outpatient Medications Prior to Visit  Medication Sig Dispense Refill  . aspirin EC 81 MG tablet Take 81 mg by mouth daily.    . citalopram (CELEXA) 20 MG tablet Take 20 mg by mouth daily.  0  . glimepiride (AMARYL) 4 MG tablet Take 4 mg by mouth daily with breakfast.    . losartan-hydrochlorothiazide (HYZAAR) 100-25 MG per tablet Take 1 tablet by mouth daily.  1  . metFORMIN (GLUCOPHAGE) 1000 MG tablet Take 1,000 mg by mouth 2 (two) times daily with a meal.     . montelukast (SINGULAIR) 10 MG tablet Take 10 mg by mouth at bedtime.    . Omega-3 Fatty Acids (FISH OIL) 1000 MG CAPS Take 1 capsule by mouth 2 (two) times daily.     No facility-administered medications prior to visit.      Allergies:   Patient has no known allergies.   Social History   Social History  . Marital status: Single    Spouse name: N/A  . Number of children: N/A  . Years of  education: N/A   Social History Main Topics  . Smoking status: Former Smoker    Quit date: 12/25/1994  . Smokeless tobacco: Never Used  . Alcohol use No  . Drug use: No  . Sexual activity: Not Asked   Other Topics Concern  . None   Social History Narrative  . None     Family History:  The patient's family history includes CVA in her father; Diabetes in her mother; Hypertension in her father.   ROS:   Please see the history of present illness.    ROS All other systems reviewed and are negative.  No flowsheet data found.     PHYSICAL EXAM:   VS:  BP 140/74   Pulse 68   Ht 5\' 6"  (1.676 m)   Wt 218 lb 6.4 oz (99.1 kg)   SpO2 96%   BMI 35.25 kg/m    GEN: Well nourished, well developed, in no acute distress  HEENT: normal  Neck: no JVD, carotid bruits, or masses Cardiac: RRR; no murmurs, rubs, or gallops,no edema.  Intact distal pulses bilaterally.  Respiratory:  clear to auscultation bilaterally, normal work of breathing GI: soft, nontender, nondistended, + BS MS: no deformity or atrophy  Skin: warm and dry, no rash Neuro:  Alert and  Oriented x 3, Strength and sensation are intact Psych: euthymic mood, full affect  Wt Readings from Last 3 Encounters:  12/07/16 218 lb 6.4 oz (99.1 kg)  12/03/15 216 lb 12.8 oz (98.3 kg)  03/17/15 213 lb 1.9 oz (96.7 kg)      Studies/Labs Reviewed:   EKG:  EKG is not ordered today.  Recent Labs: No results found for requested labs within last 8760 hours.   Lipid Panel No results found for: CHOL, TRIG, HDL, CHOLHDL, VLDL, LDLCALC, LDLDIRECT  Additional studies/ records that were reviewed today include:  CPAP download    ASSESSMENT:    1. Obstructive sleep apnea   2. Essential hypertension, benign   3. Obesity (BMI 30-39.9)      PLAN:  In order of problems listed above:  OSA - the patient is tolerating PAP therapy well without any problems. The PAP download was reviewed today and showed an AHI of 6.5/hr on 10cm H2O  with 69% compliance in using more than 4 hours nightly.  The patient has been using and benefiting from CPAP use and will continue to benefit from therapy.  2.   HTN - BP controlled on current meds. Continue ARB and diuretic. 3.   Obesity - I have encouraged him to get into a routine exercise program and cut back on carbs and portions.     Medication Adjustments/Labs and Tests Ordered: Current medicines are reviewed at length with the patient today.  Concerns regarding medicines are outlined above.  Medication changes, Labs and Tests ordered today are listed in the Patient Instructions below.  There are no Patient Instructions on file for this visit.   Signed, Fransico Him, MD  12/07/2016 8:53 AM    [facility] [place], [place], [place]  [address] Phone: [phone]; Fax: [phone]

## 2016-12-07 NOTE — Patient Instructions (Signed)

## 2017-01-04 DIAGNOSIS — J301 Allergic rhinitis due to pollen: Secondary | ICD-10-CM | POA: Diagnosis not present

## 2017-01-04 DIAGNOSIS — Z7984 Long term (current) use of oral hypoglycemic drugs: Secondary | ICD-10-CM | POA: Diagnosis not present

## 2017-01-04 DIAGNOSIS — E669 Obesity, unspecified: Secondary | ICD-10-CM | POA: Diagnosis not present

## 2017-01-04 DIAGNOSIS — F324 Major depressive disorder, single episode, in partial remission: Secondary | ICD-10-CM | POA: Diagnosis not present

## 2017-01-04 DIAGNOSIS — Z6835 Body mass index (BMI) 35.0-35.9, adult: Secondary | ICD-10-CM | POA: Diagnosis not present

## 2017-01-04 DIAGNOSIS — E119 Type 2 diabetes mellitus without complications: Secondary | ICD-10-CM | POA: Diagnosis not present

## 2017-01-04 DIAGNOSIS — E1165 Type 2 diabetes mellitus with hyperglycemia: Secondary | ICD-10-CM | POA: Diagnosis not present

## 2017-01-04 DIAGNOSIS — G4733 Obstructive sleep apnea (adult) (pediatric): Secondary | ICD-10-CM | POA: Diagnosis not present

## 2017-01-04 DIAGNOSIS — I10 Essential (primary) hypertension: Secondary | ICD-10-CM | POA: Diagnosis not present

## 2017-01-04 DIAGNOSIS — E7800 Pure hypercholesterolemia, unspecified: Secondary | ICD-10-CM | POA: Diagnosis not present

## 2017-01-04 DIAGNOSIS — Z0000 Encounter for general adult medical examination without abnormal findings: Secondary | ICD-10-CM | POA: Diagnosis not present

## 2017-02-15 DIAGNOSIS — K5730 Diverticulosis of large intestine without perforation or abscess without bleeding: Secondary | ICD-10-CM | POA: Diagnosis not present

## 2017-02-15 DIAGNOSIS — D126 Benign neoplasm of colon, unspecified: Secondary | ICD-10-CM | POA: Diagnosis not present

## 2017-02-15 DIAGNOSIS — Z1211 Encounter for screening for malignant neoplasm of colon: Secondary | ICD-10-CM | POA: Diagnosis not present

## 2017-02-20 DIAGNOSIS — D126 Benign neoplasm of colon, unspecified: Secondary | ICD-10-CM | POA: Diagnosis not present

## 2017-02-20 DIAGNOSIS — Z1211 Encounter for screening for malignant neoplasm of colon: Secondary | ICD-10-CM | POA: Diagnosis not present

## 2017-03-01 DIAGNOSIS — Z780 Asymptomatic menopausal state: Secondary | ICD-10-CM | POA: Diagnosis not present

## 2017-03-01 DIAGNOSIS — Z13820 Encounter for screening for osteoporosis: Secondary | ICD-10-CM | POA: Diagnosis not present

## 2017-05-02 DIAGNOSIS — M722 Plantar fascial fibromatosis: Secondary | ICD-10-CM | POA: Diagnosis not present

## 2017-07-05 DIAGNOSIS — G4733 Obstructive sleep apnea (adult) (pediatric): Secondary | ICD-10-CM | POA: Diagnosis not present

## 2017-07-05 DIAGNOSIS — Z7984 Long term (current) use of oral hypoglycemic drugs: Secondary | ICD-10-CM | POA: Diagnosis not present

## 2017-07-05 DIAGNOSIS — E7800 Pure hypercholesterolemia, unspecified: Secondary | ICD-10-CM | POA: Diagnosis not present

## 2017-07-05 DIAGNOSIS — E669 Obesity, unspecified: Secondary | ICD-10-CM | POA: Diagnosis not present

## 2017-07-05 DIAGNOSIS — I10 Essential (primary) hypertension: Secondary | ICD-10-CM | POA: Diagnosis not present

## 2017-07-05 DIAGNOSIS — E1165 Type 2 diabetes mellitus with hyperglycemia: Secondary | ICD-10-CM | POA: Diagnosis not present

## 2017-10-30 DIAGNOSIS — Z23 Encounter for immunization: Secondary | ICD-10-CM | POA: Diagnosis not present

## 2017-12-05 DIAGNOSIS — Z1231 Encounter for screening mammogram for malignant neoplasm of breast: Secondary | ICD-10-CM | POA: Diagnosis not present

## 2018-03-07 DIAGNOSIS — Z7984 Long term (current) use of oral hypoglycemic drugs: Secondary | ICD-10-CM | POA: Diagnosis not present

## 2018-03-07 DIAGNOSIS — G4733 Obstructive sleep apnea (adult) (pediatric): Secondary | ICD-10-CM | POA: Diagnosis not present

## 2018-03-07 DIAGNOSIS — E7800 Pure hypercholesterolemia, unspecified: Secondary | ICD-10-CM | POA: Diagnosis not present

## 2018-03-07 DIAGNOSIS — F324 Major depressive disorder, single episode, in partial remission: Secondary | ICD-10-CM | POA: Diagnosis not present

## 2018-03-07 DIAGNOSIS — I10 Essential (primary) hypertension: Secondary | ICD-10-CM | POA: Diagnosis not present

## 2018-03-07 DIAGNOSIS — J301 Allergic rhinitis due to pollen: Secondary | ICD-10-CM | POA: Diagnosis not present

## 2018-03-07 DIAGNOSIS — Z0000 Encounter for general adult medical examination without abnormal findings: Secondary | ICD-10-CM | POA: Diagnosis not present

## 2018-03-07 DIAGNOSIS — E669 Obesity, unspecified: Secondary | ICD-10-CM | POA: Diagnosis not present

## 2018-03-07 DIAGNOSIS — E119 Type 2 diabetes mellitus without complications: Secondary | ICD-10-CM | POA: Diagnosis not present

## 2018-03-07 DIAGNOSIS — Z1159 Encounter for screening for other viral diseases: Secondary | ICD-10-CM | POA: Diagnosis not present

## 2018-04-11 ENCOUNTER — Ambulatory Visit (INDEPENDENT_AMBULATORY_CARE_PROVIDER_SITE_OTHER): Payer: Medicare Other | Admitting: Cardiology

## 2018-04-11 ENCOUNTER — Encounter: Payer: Self-pay | Admitting: Cardiology

## 2018-04-11 VITALS — BP 142/90 | HR 64 | Ht 66.0 in | Wt 216.0 lb

## 2018-04-11 DIAGNOSIS — E669 Obesity, unspecified: Secondary | ICD-10-CM

## 2018-04-11 DIAGNOSIS — G4733 Obstructive sleep apnea (adult) (pediatric): Secondary | ICD-10-CM | POA: Diagnosis not present

## 2018-04-11 DIAGNOSIS — I10 Essential (primary) hypertension: Secondary | ICD-10-CM

## 2018-04-11 NOTE — Progress Notes (Signed)
Cardiology Office Note:    Date:  04/11/2018   ID:  Angela Heath, DOB 1946/09/12, MRN [id number]  PCP:  Kelton Pillar, MD  Cardiologist:  No primary care provider on file.    Referring MD: Kelton Pillar, MD   Chief Complaint  Patient presents with  . Sleep Apnea  . Hypertension    History of Present Illness:    Angela Heath is a 72 y.o. female with a hx of OSA, HTN and obesity.  She is doing well with her CPAP device.  She tolerates the mask and feels the pressure is adequate.  Since going on CPAP she feels rested in the am and has no significant daytime sleepiness.  She denies any significant mouth or nasal dryness or nasal congestion.  She does not think that he snores.     Past Medical History:  Diagnosis Date  . Depression    with anxious features  . Diabetes ([place]) 04/2010  . Hormone replacement therapy   . Hypertension   . OSA (obstructive sleep apnea)     No past surgical history on file.  Current Medications: Current Meds  Medication Sig  . aspirin EC 81 MG tablet Take 81 mg by mouth daily.  . citalopram (CELEXA) 20 MG tablet Take 20 mg by mouth daily.  Marland Kitchen glimepiride (AMARYL) 4 MG tablet Take 4 mg by mouth daily with breakfast.  . losartan-hydrochlorothiazide (HYZAAR) 100-25 MG per tablet Take 1 tablet by mouth daily.  . metFORMIN (GLUCOPHAGE) 1000 MG tablet Take 1,000 mg by mouth 2 (two) times daily with a meal.   . montelukast (SINGULAIR) 10 MG tablet Take 10 mg by mouth at bedtime.  . Omega-3 Fatty Acids (FISH OIL) 1000 MG CAPS Take 1 capsule by mouth 2 (two) times daily.     Allergies:   Patient has no known allergies.   Social History   Socioeconomic History  . Marital status: Single    Spouse name: Not on file  . Number of children: Not on file  . Years of education: Not on file  . Highest education level: Not on file  Occupational History  . Not on file  Social Needs  . Financial resource strain: Not on file  . Food insecurity:      Worry: Not on file    Inability: Not on file  . Transportation needs:    Medical: Not on file    Non-medical: Not on file  Tobacco Use  . Smoking status: Former Smoker    Last attempt to quit: 12/25/1994    Years since quitting: 23.3  . Smokeless tobacco: Never Used  Substance and Sexual Activity  . Alcohol use: No  . Drug use: No  . Sexual activity: Not on file  Lifestyle  . Physical activity:    Days per week: Not on file    Minutes per session: Not on file  . Stress: Not on file  Relationships  . Social connections:    Talks on phone: Not on file    Gets together: Not on file    Attends religious service: Not on file    Active member of club or organization: Not on file    Attends meetings of clubs or organizations: Not on file    Relationship status: Not on file  Other Topics Concern  . Not on file  Social History Narrative  . Not on file     Family History: The patient's family history includes CVA in her father;  Diabetes in her mother; Hypertension in her father.  ROS:   Please see the history of present illness.    ROS  All other systems reviewed and negative.   EKGs/Labs/Other Studies Reviewed:    The following studies were reviewed today: PAP download  EKG:  EKG is not ordered today.    Recent Labs: No results found for requested labs within last 8760 hours.   Recent Lipid Panel No results found for: CHOL, TRIG, HDL, CHOLHDL, VLDL, LDLCALC, LDLDIRECT  Physical Exam:    VS:  BP (!) 142/90   Pulse 64   Ht 5\' 6"  (1.676 m)   Wt 216 lb (98 kg)   SpO2 94%   BMI 34.86 kg/m      Wt Readings from Last 3 Encounters:  04/11/18 216 lb (98 kg)  12/07/16 218 lb 6.4 oz (99.1 kg)  12/03/15 216 lb 12.8 oz (98.3 kg)     GEN:  Well nourished, well developed in no acute distress HEENT: Normal NECK: No JVD; No carotid bruits LYMPHATICS: No lymphadenopathy CARDIAC: RRR, no murmurs, rubs, gallops RESPIRATORY:  Clear to auscultation without rales,  wheezing or rhonchi  ABDOMEN: Soft, non-tender, non-distended MUSCULOSKELETAL:  No edema; No deformity  SKIN: Warm and dry NEUROLOGIC:  Alert and oriented x 3 PSYCHIATRIC:  Normal affect   ASSESSMENT:    1. Obstructive sleep apnea   2. Essential hypertension, benign   3. Obesity (BMI 30-39.9)    PLAN:    In order of problems listed above:  1.  OSA - the patient is tolerating PAP therapy well without any problems.The patient has been using and benefiting from PAP use and will continue to benefit from therapy.  She brought her download card in today but unfortunately it did not have any information on it.  Her machine is well over 69 years old and is not working as well as it used to and she would like a new CPAP device which we will go ahead and order.  I will order her an air since CPAP with heated humidifier set at 10 cm water pressure.  She will see me back in the office in 10 weeks for follow-up per insurance requirements for new device.  2.  HTN - BP is well controlled on exam today.  She will continue on Hyzaar 100-25mg  daily.    3.  Obesity - I have encouraged him to get into a routine exercise program and cut back on carbs and portions.    Medication Adjustments/Labs and Tests Ordered: Current medicines are reviewed at length with the patient today.  Concerns regarding medicines are outlined above.  No orders of the defined types were placed in this encounter.  No orders of the defined types were placed in this encounter.   Signed, Fransico Him, MD  04/11/2018 1:07 PM    [place]

## 2018-04-11 NOTE — Patient Instructions (Signed)
Medication Instructions:  Your physician recommends that you continue on your current medications as directed. Please refer to the Current Medication list given to you today.  Labwork: None Ordered   Testing/Procedures: None Ordered   Follow-Up: Your physician recommends that you schedule a follow-up appointment in: 10 weeks after receiving your new CPAP   Any Other Special Instructions Will Be Listed Below (If Applicable).  CPAP orders have been placed. You will receive a call from the home health agency regarding setting up equipment. If you do not receive a call within the next week give Gae Bon, CPAP assistant a call at [phone].   Thank you for choosing [facility], RN  [phone]    If you need a refill on your cardiac medications before your next appointment, please call your pharmacy.

## 2018-04-12 ENCOUNTER — Telehealth: Payer: Self-pay | Admitting: *Deleted

## 2018-04-12 NOTE — Telephone Encounter (Signed)
-----   Message from Teressa Senter, RN sent at 04/11/2018  1:37 PM EDT ----- Regarding: dme order dme order placed for new cpap   Thanks  [place]

## 2018-04-12 NOTE — Telephone Encounter (Signed)
Airsense CPAP device with heated humidity set at 10 cm water pressure Order faxed to [facility]

## 2018-05-01 NOTE — Telephone Encounter (Signed)
Patient has a 10 week follow up appointment scheduled for 07/10/2018 at 9 am . Patient understands she needs to keep this appointment for insurance compliance. Patient was grateful for the call and thanked me.

## 2018-05-12 DIAGNOSIS — R05 Cough: Secondary | ICD-10-CM | POA: Diagnosis not present

## 2018-05-12 DIAGNOSIS — H6123 Impacted cerumen, bilateral: Secondary | ICD-10-CM | POA: Diagnosis not present

## 2018-05-12 DIAGNOSIS — H938X3 Other specified disorders of ear, bilateral: Secondary | ICD-10-CM | POA: Diagnosis not present

## 2018-07-09 NOTE — Progress Notes (Signed)
Cardiology Office Note:    Date:  07/10/2018   ID:  Angela Heath, DOB 09-05-1946, MRN [id number]  PCP:  Kelton Pillar, MD  Cardiologist:  No primary care provider on file.    Referring MD: Kelton Pillar, MD   Chief Complaint  Patient presents with  . Sleep Apnea  . Hypertension    History of Present Illness:    Angela Heath is a 72 y.o. female with a hx of OSA, HTN and obesity.  She is doing well with her CPAP device and thinks that she has gotten used to it.  She tolerates the mask and feels the pressure is adequate.  Since going on CPAP she feels rested in the am and has no significant daytime sleepiness.  She denies any significant mouth or nasal dryness or nasal congestion.  She does not think that he snores.     Past Medical History:  Diagnosis Date  . Depression    with anxious features  . Diabetes ([place]) 04/2010  . Hormone replacement therapy   . Hypertension   . OSA (obstructive sleep apnea)     History reviewed. No pertinent surgical history.  Current Medications: Current Meds  Medication Sig  . aspirin EC 81 MG tablet Take 81 mg by mouth daily.  . citalopram (CELEXA) 20 MG tablet Take 20 mg by mouth daily.  Marland Kitchen glimepiride (AMARYL) 4 MG tablet Take 4 mg by mouth daily with breakfast.  . losartan-hydrochlorothiazide (HYZAAR) 100-25 MG per tablet Take 1 tablet by mouth daily.  . metFORMIN (GLUCOPHAGE) 1000 MG tablet Take 1,000 mg by mouth 2 (two) times daily with a meal.   . montelukast (SINGULAIR) 10 MG tablet Take 10 mg by mouth at bedtime.  . Omega-3 Fatty Acids (FISH OIL) 1000 MG CAPS Take 1 capsule by mouth 2 (two) times daily.     Allergies:   Patient has no known allergies.   Social History   Socioeconomic History  . Marital status: Single    Spouse name: Not on file  . Number of children: Not on file  . Years of education: Not on file  . Highest education level: Not on file  Occupational History  . Not on file  Social Needs  .  Financial resource strain: Not on file  . Food insecurity:    Worry: Not on file    Inability: Not on file  . Transportation needs:    Medical: Not on file    Non-medical: Not on file  Tobacco Use  . Smoking status: Former Smoker    Last attempt to quit: 12/25/1994    Years since quitting: 23.5  . Smokeless tobacco: Never Used  Substance and Sexual Activity  . Alcohol use: No  . Drug use: No  . Sexual activity: Not on file  Lifestyle  . Physical activity:    Days per week: Not on file    Minutes per session: Not on file  . Stress: Not on file  Relationships  . Social connections:    Talks on phone: Not on file    Gets together: Not on file    Attends religious service: Not on file    Active member of club or organization: Not on file    Attends meetings of clubs or organizations: Not on file    Relationship status: Not on file  Other Topics Concern  . Not on file  Social History Narrative  . Not on file     Family History: The  patient's family history includes CVA in her father; Diabetes in her mother; Hypertension in her father.  ROS:   Please see the history of present illness.    ROS  All other systems reviewed and negative.   EKGs/Labs/Other Studies Reviewed:    The following studies were reviewed today: PAP download  EKG:  EKG is not ordered today.    Recent Labs: No results found for requested labs within last 8760 hours.   Recent Lipid Panel No results found for: CHOL, TRIG, HDL, CHOLHDL, VLDL, LDLCALC, LDLDIRECT  Physical Exam:    VS:  BP (!) 148/82   Pulse 65   Ht 5\' 6"  (1.676 m)   Wt 216 lb (98 kg)   SpO2 95%   BMI 34.86 kg/m     Wt Readings from Last 3 Encounters:  07/10/18 216 lb (98 kg)  04/11/18 216 lb (98 kg)  12/07/16 218 lb 6.4 oz (99.1 kg)     GEN:  Well nourished, well developed in no acute distress HEENT: Normal NECK: No JVD; No carotid bruits LYMPHATICS: No lymphadenopathy CARDIAC: RRR, no murmurs, rubs,  gallops RESPIRATORY:  Clear to auscultation without rales, wheezing or rhonchi  ABDOMEN: Soft, non-tender, non-distended MUSCULOSKELETAL:  No edema; No deformity  SKIN: Warm and dry NEUROLOGIC:  Alert and oriented x 3 PSYCHIATRIC:  Normal affect   ASSESSMENT:    1. Obstructive sleep apnea   2. Essential hypertension, benign   3. Obesity (BMI 30-39.9)    PLAN:    In order of problems listed above:  1.  OSA - the patient is tolerating PAP therapy well without any problems. The PAP download was reviewed today and showed an AHI of 1.2/hr on 10 cm H2O with 97% compliance in using more than 4 hours nightly.  The patient has been using and benefiting from PAP use and will continue to benefit from therapy.   2.  HTN - BP is borderline controlled on exam today.  She will continue on Hyzaar 100-25mg  daily.  3.  Obesity - I have encouraged her to get into a routine exercise program and cut back on carbs and portions.    Medication Adjustments/Labs and Tests Ordered: Current medicines are reviewed at length with the patient today.  Concerns regarding medicines are outlined above.  No orders of the defined types were placed in this encounter.  No orders of the defined types were placed in this encounter.   Signed, Fransico Him, MD  07/10/2018 9:07 AM    [place]

## 2018-07-10 ENCOUNTER — Ambulatory Visit (INDEPENDENT_AMBULATORY_CARE_PROVIDER_SITE_OTHER): Payer: Medicare Other | Admitting: Cardiology

## 2018-07-10 ENCOUNTER — Encounter (INDEPENDENT_AMBULATORY_CARE_PROVIDER_SITE_OTHER): Payer: Self-pay

## 2018-07-10 ENCOUNTER — Encounter: Payer: Self-pay | Admitting: Cardiology

## 2018-07-10 VITALS — BP 148/82 | HR 65 | Ht 66.0 in | Wt 216.0 lb

## 2018-07-10 DIAGNOSIS — I10 Essential (primary) hypertension: Secondary | ICD-10-CM | POA: Diagnosis not present

## 2018-07-10 DIAGNOSIS — E669 Obesity, unspecified: Secondary | ICD-10-CM | POA: Diagnosis not present

## 2018-07-10 DIAGNOSIS — G4733 Obstructive sleep apnea (adult) (pediatric): Secondary | ICD-10-CM | POA: Diagnosis not present

## 2018-07-10 NOTE — Patient Instructions (Signed)

## 2018-10-21 DIAGNOSIS — Z23 Encounter for immunization: Secondary | ICD-10-CM | POA: Diagnosis not present

## 2018-12-11 DIAGNOSIS — E7800 Pure hypercholesterolemia, unspecified: Secondary | ICD-10-CM | POA: Diagnosis not present

## 2018-12-11 DIAGNOSIS — Z7984 Long term (current) use of oral hypoglycemic drugs: Secondary | ICD-10-CM | POA: Diagnosis not present

## 2018-12-11 DIAGNOSIS — Z1231 Encounter for screening mammogram for malignant neoplasm of breast: Secondary | ICD-10-CM | POA: Diagnosis not present

## 2018-12-11 DIAGNOSIS — E669 Obesity, unspecified: Secondary | ICD-10-CM | POA: Diagnosis not present

## 2018-12-11 DIAGNOSIS — E1169 Type 2 diabetes mellitus with other specified complication: Secondary | ICD-10-CM | POA: Diagnosis not present

## 2018-12-11 DIAGNOSIS — I10 Essential (primary) hypertension: Secondary | ICD-10-CM | POA: Diagnosis not present

## 2019-07-09 DIAGNOSIS — Z1389 Encounter for screening for other disorder: Secondary | ICD-10-CM | POA: Diagnosis not present

## 2019-07-09 DIAGNOSIS — I10 Essential (primary) hypertension: Secondary | ICD-10-CM | POA: Diagnosis not present

## 2019-07-09 DIAGNOSIS — G4733 Obstructive sleep apnea (adult) (pediatric): Secondary | ICD-10-CM | POA: Diagnosis not present

## 2019-07-09 DIAGNOSIS — E669 Obesity, unspecified: Secondary | ICD-10-CM | POA: Diagnosis not present

## 2019-07-09 DIAGNOSIS — F324 Major depressive disorder, single episode, in partial remission: Secondary | ICD-10-CM | POA: Diagnosis not present

## 2019-07-09 DIAGNOSIS — R2689 Other abnormalities of gait and mobility: Secondary | ICD-10-CM | POA: Diagnosis not present

## 2019-07-09 DIAGNOSIS — E7800 Pure hypercholesterolemia, unspecified: Secondary | ICD-10-CM | POA: Diagnosis not present

## 2019-07-09 DIAGNOSIS — J301 Allergic rhinitis due to pollen: Secondary | ICD-10-CM | POA: Diagnosis not present

## 2019-07-09 DIAGNOSIS — Z0000 Encounter for general adult medical examination without abnormal findings: Secondary | ICD-10-CM | POA: Diagnosis not present

## 2019-07-09 DIAGNOSIS — E1169 Type 2 diabetes mellitus with other specified complication: Secondary | ICD-10-CM | POA: Diagnosis not present

## 2019-07-16 DIAGNOSIS — H818X3 Other disorders of vestibular function, bilateral: Secondary | ICD-10-CM | POA: Diagnosis not present

## 2019-07-16 DIAGNOSIS — R42 Dizziness and giddiness: Secondary | ICD-10-CM | POA: Diagnosis not present

## 2019-07-16 DIAGNOSIS — R94121 Abnormal vestibular function study: Secondary | ICD-10-CM | POA: Diagnosis not present

## 2019-07-17 DIAGNOSIS — E1169 Type 2 diabetes mellitus with other specified complication: Secondary | ICD-10-CM | POA: Diagnosis not present

## 2019-07-17 DIAGNOSIS — I10 Essential (primary) hypertension: Secondary | ICD-10-CM | POA: Diagnosis not present

## 2019-07-31 DIAGNOSIS — R42 Dizziness and giddiness: Secondary | ICD-10-CM | POA: Diagnosis not present

## 2019-07-31 DIAGNOSIS — R94121 Abnormal vestibular function study: Secondary | ICD-10-CM | POA: Diagnosis not present

## 2019-07-31 DIAGNOSIS — H818X3 Other disorders of vestibular function, bilateral: Secondary | ICD-10-CM | POA: Diagnosis not present

## 2019-08-07 DIAGNOSIS — R94121 Abnormal vestibular function study: Secondary | ICD-10-CM | POA: Diagnosis not present

## 2019-08-07 DIAGNOSIS — R42 Dizziness and giddiness: Secondary | ICD-10-CM | POA: Diagnosis not present

## 2019-08-07 DIAGNOSIS — H818X3 Other disorders of vestibular function, bilateral: Secondary | ICD-10-CM | POA: Diagnosis not present

## 2019-08-13 ENCOUNTER — Encounter: Payer: Self-pay | Admitting: Cardiology

## 2019-08-13 ENCOUNTER — Telehealth: Payer: Self-pay

## 2019-08-13 ENCOUNTER — Telehealth (INDEPENDENT_AMBULATORY_CARE_PROVIDER_SITE_OTHER): Payer: Medicare Other | Admitting: Cardiology

## 2019-08-13 ENCOUNTER — Other Ambulatory Visit: Payer: Self-pay

## 2019-08-13 VITALS — Ht 66.0 in | Wt 210.0 lb

## 2019-08-13 DIAGNOSIS — Z9989 Dependence on other enabling machines and devices: Secondary | ICD-10-CM | POA: Diagnosis not present

## 2019-08-13 DIAGNOSIS — G4733 Obstructive sleep apnea (adult) (pediatric): Secondary | ICD-10-CM | POA: Diagnosis not present

## 2019-08-13 NOTE — Telephone Encounter (Signed)
YOUR CARDIOLOGY TEAM HAS ARRANGED FOR AN E-VISIT FOR YOUR APPOINTMENT - PLEASE REVIEW IMPORTANT INFORMATION BELOW SEVERAL DAYS PRIOR TO YOUR APPOINTMENT  Due to the recent COVID-19 pandemic, we are transitioning in-person office visits to tele-medicine visits in an effort to decrease unnecessary exposure to our patients, their families, and staff. These visits are billed to your insurance just like a normal visit is. We also encourage you to sign up for MyChart if you have not already done so. You will need a smartphone if possible. For patients that do not have this, we can still complete the visit using a regular telephone but do prefer a smartphone to enable video when possible. You may have a family member that lives with you that can help. If possible, we also ask that you have a blood pressure cuff and scale at home to measure your blood pressure, heart rate and weight prior to your scheduled appointment. Patients with clinical needs that need an in-person evaluation and testing will still be able to come to the office if absolutely necessary. If you have any questions, feel free to call our office.     YOUR PROVIDER WILL BE USING THE FOLLOWING PLATFORM TO COMPLETE YOUR VISIT: [employer] . IF USING MYCHART - How to Download the MyChart App to Your SmartPhone   - If Apple, go to [employer] and type in MyChart in the search bar and download the app. If Android, ask patient to go to [employer] and type in [place] in the search bar and download the app. The app is free but as with any other app downloads, your phone may require you to verify saved payment information or Apple/Android password.  - You will need to then log into the app with your MyChart username and password, and select [facility] as your healthcare provider to link the account.  - When it is time for your visit, go to the MyChart app, find appointments, and click Begin Video Visit. Be sure to Select Allow for your device to access  the Microphone and Camera for your visit. You will then be connected, and your provider will be with you shortly.  **If you have any issues connecting or need assistance, please contact MyChart service desk [phone])**  **If using a computer, in order to ensure the best quality for your visit, you will need to use either of the following Internet Browsers: Insurance underwriter or [employer]**  . IF USING DOXIMITY or DOXY.ME - The staff will give you instructions on receiving your link to join the meeting the day of your visit.      2-3 DAYS BEFORE YOUR APPOINTMENT  You will receive a telephone call from one of our [facility] team members - your caller ID may say "Unknown caller." If this is a video visit, we will walk you through how to get the video launched on your phone. We will remind you check your blood pressure, heart rate and weight prior to your scheduled appointment. If you have an Apple Watch or Kardia, please upload any pertinent ECG strips the day before or morning of your appointment to [place]. Our staff will also make sure you have reviewed the consent and agree to move forward with your scheduled tele-health visit.     THE DAY OF YOUR APPOINTMENT  Approximately 15 minutes prior to your scheduled appointment, you will receive a telephone call from one of [facility] team - your caller ID may say "Unknown caller."  Our  Our staff will confirm medications, vital signs for the day and any symptoms you may be experiencing. Please have this information available prior to the time of visit start. It may also be helpful for you to have a pad of paper and pen handy for any instructions given during your visit. They will also walk you through joining the smartphone meeting if this is a video visit.    CONSENT FOR TELE-HEALTH VISIT - PLEASE REVIEW  I hereby voluntarily request, consent and authorize [facility] and its employed or contracted physicians, physician  assistants, nurse practitioners or other licensed health care professionals (the Practitioner), to provide me with telemedicine health care services (the "Services") as deemed necessary by the treating Practitioner. I acknowledge and consent to receive the Services by the Practitioner via telemedicine. I understand that the telemedicine visit will involve communicating with the Practitioner through live audiovisual communication technology and the disclosure of certain medical information by electronic transmission. I acknowledge that I have been given the opportunity to request an in-person assessment or other available alternative prior to the telemedicine visit and am voluntarily participating in the telemedicine visit.  I understand that I have the right to withhold or withdraw my consent to the use of telemedicine in the course of my care at any time, without affecting my right to future care or treatment, and that the Practitioner or I may terminate the telemedicine visit at any time. I understand that I have the right to inspect all information obtained and/or recorded in the course of the telemedicine visit and may receive copies of available information for a reasonable fee.  I understand that some of the potential risks of receiving the Services via telemedicine include:  . Delay or interruption in medical evaluation due to technological equipment failure or disruption; . Information transmitted may not be sufficient (e.g. poor resolution of images) to allow for appropriate medical decision making by the Practitioner; and/or  . In rare instances, security protocols could fail, causing a breach of personal health information.  Furthermore, I acknowledge that it is my responsibility to provide information about my medical history, conditions and care that is complete and accurate to the best of my ability. I acknowledge that Practitioner's advice, recommendations, and/or decision may be based on  factors not within their control, such as incomplete or inaccurate data provided by me or distortions of diagnostic images or specimens that may result from electronic transmissions. I understand that the practice of medicine is not an exact science and that Practitioner makes no warranties or guarantees regarding treatment outcomes. I acknowledge that I will receive a copy of this consent concurrently upon execution via email to the email address I last provided but may also request a printed copy by calling the office of [facility].    I understand that my insurance will be billed for this visit.   I have read or had this consent read to me. . I understand the contents of this consent, which adequately explains the benefits and risks of the Services being provided via telemedicine.  . I have been provided ample opportunity to ask questions regarding this consent and the Services and have had my questions answered to my satisfaction. . I give my informed consent for the services to be provided through the use of telemedicine in my medical care  By participating in this telemedicine visit I agree to the above. Yes 

## 2019-08-13 NOTE — Progress Notes (Signed)
Virtual Visit via Video Note   This visit type was conducted due to national recommendations for restrictions regarding the COVID-19 Pandemic (e.g. social distancing) in an effort to limit this patient's exposure and mitigate transmission in our community.  Due to her co-morbid illnesses, this patient is at least at moderate risk for complications without adequate follow up.  This format is felt to be most appropriate for this patient at this time.  All issues noted in this document were discussed and addressed.  A limited physical exam was performed with this format.  Please refer to the patient's chart for her consent to telehealth for [facility].   Date:  08/13/2019   ID:  Arline Asp, DOB 01/01/1946, MRN [id number]  Patient Location: Home Provider Location: Home  PCP:  Kelton Pillar, MD  Cardiologist:  Dr. Radford Pax Electrophysiologist:  None   Evaluation Performed:  Follow-Up Visit  Chief Complaint:  F/u for OSA   History of Present Illness:    ASHIKA APUZZO is a 73 y.o. female with with obesity, hypertension and obstructive sleep apnea on CPAP therapy.  She is followed by Dr. Radford Pax.  She also has diabetes, followed by her PCP. Labs are followed by PCP.   Patient reports that she has done well over the last year since her last office visit.  She has been fully compliant with CPAP nightly.  She admits that she does not get much exercise.  She has had some recent issues with gait instability and PCP recently referred her for vestibular physical therapy which she feels is helping.  She denies any cardiac symptoms.  No chest pain, dyspnea, lower extremity edema, lightheadedness, dizziness, syncope/near syncope.  Unfortunately she does not have a home blood pressure cuff available to check vital signs today.  She reports that she was recently seen by her PCP and had routine laboratory work done.  She does mention that gemfibrozil was added for high triglycerides.  No other  medication changes.  The patient does not have symptoms concerning for COVID-19 infection (fever, chills, cough, or new shortness of breath).    Past Medical History:  Diagnosis Date   Depression    with anxious features   Diabetes ([place]) 04/2010   Hormone replacement therapy    Hypertension    OSA (obstructive sleep apnea)    No past surgical history on file.   Current Meds  Medication Sig   aspirin EC 81 MG tablet Take 81 mg by mouth daily.   citalopram (CELEXA) 20 MG tablet Take 20 mg by mouth daily.   gemfibrozil (LOPID) 600 MG tablet Take 600 mg by mouth 2 (two) times daily.   glimepiride (AMARYL) 4 MG tablet Take 4 mg by mouth daily with breakfast.   losartan-hydrochlorothiazide (HYZAAR) 100-25 MG per tablet Take 1 tablet by mouth daily.   metFORMIN (GLUCOPHAGE) 1000 MG tablet Take 1,000 mg by mouth 2 (two) times daily with a meal.    montelukast (SINGULAIR) 10 MG tablet Take 10 mg by mouth at bedtime.   Omega-3 Fatty Acids (FISH OIL) 1000 MG CAPS Take 1 capsule by mouth daily.      Allergies:   Patient has no known allergies.   Social History   Tobacco Use   Smoking status: Former Smoker    Quit date: 12/25/1994    Years since quitting: 24.6   Smokeless tobacco: Never Used  Substance Use Topics   Alcohol use: No   Drug use: No  Family Hx: The patient's family history includes CVA in her father; Diabetes in her mother; Hypertension in her father.  ROS:   Please see the history of present illness.     All other systems reviewed and are negative.   Prior CV studies:   The following studies were reviewed today:  None   Labs/Other Tests and Data Reviewed:    EKG:  No ECG reviewed.  Recent Labs: No results found for requested labs within last 8760 hours.   Recent Lipid Panel No results found for: CHOL, TRIG, HDL, CHOLHDL, LDLCALC, LDLDIRECT  Wt Readings from Last 3 Encounters:  08/13/19 210 lb (95.3 kg)  07/10/18 216 lb (98 kg)    04/11/18 216 lb (98 kg)     Objective:    Vital Signs:  Ht 5\' 6"  (1.676 m)    Wt 210 lb (95.3 kg)    BMI 33.89 kg/m    VITAL SIGNS:  reviewed GEN:  no acute distress EYES:  sclerae anicteric, EOMI - Extraocular Movements Intact RESPIRATORY:  normal respiratory effort, symmetric expansion CARDIOVASCULAR:  no peripheral edema SKIN:  no rash, lesions or ulcers. MUSCULOSKELETAL:  no obvious deformities. NEURO:  alert and oriented x 3, no obvious focal deficit PSYCH:  normal affect  ASSESSMENT & PLAN:    1. OSA: reports full compliance w/ CPAP. Tolerating well. Good rest. No daytime fatigue.   2. HTN: followed by PCP. No home BP cuff to check VS today.   3. DM: followed by PCP.   4. Hypertriglyceridemia: Lipids followed by PCP. Recently started on Gemfibrozil by PCP.   5. Obesity: I encouraged weight loss and increasing physical activity.   COVID-19 Education: The signs and symptoms of COVID-19 were discussed with the patient and how to seek care for testing (follow up with PCP or arrange E-visit).  The importance of social distancing was discussed today.  Time:   Today, I have spent 15 minutes with the patient with telehealth technology discussing the above problems.     Medication Adjustments/Labs and Tests Ordered: Current medicines are reviewed at length with the patient today.  Concerns regarding medicines are outlined above.   Tests Ordered: No orders of the defined types were placed in this encounter.   Medication Changes: No orders of the defined types were placed in this encounter.   Follow Up:  In Person in 1 year(s) w/ Dr. Radford Pax   Signed, Lyda Jester, PA-C  08/13/2019 11:07 AM    [place]

## 2019-08-14 ENCOUNTER — Telehealth: Payer: Self-pay | Admitting: *Deleted

## 2019-08-14 DIAGNOSIS — R94121 Abnormal vestibular function study: Secondary | ICD-10-CM | POA: Diagnosis not present

## 2019-08-14 DIAGNOSIS — G4733 Obstructive sleep apnea (adult) (pediatric): Secondary | ICD-10-CM

## 2019-08-14 DIAGNOSIS — H818X3 Other disorders of vestibular function, bilateral: Secondary | ICD-10-CM | POA: Diagnosis not present

## 2019-08-14 DIAGNOSIS — R42 Dizziness and giddiness: Secondary | ICD-10-CM | POA: Diagnosis not present

## 2019-08-14 NOTE — Telephone Encounter (Signed)
Called [facility]. Will order cpap supplies

## 2019-08-14 NOTE — Telephone Encounter (Signed)
-----   Message from Sueanne Margarita, MD sent at 08/14/2019 12:54 PM EDT ----- Regarding: RE: Sleep Clinic Pt PAP download looks great.  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) [place], [id number] Compliance Report Usage 07/15/2019 - 08/13/2019 Usage days 30/30 days (100%) >= 4 hours 29 days (97%) < 4 hours 1 days (3%) Usage hours 244 hours 55 minutes Average usage (total days) 8 hours 10 minutes Average usage (days used) 8 hours 10 minutes Median usage (days used) 8 hours 35 minutes Total used hours (value since last reset - 08/13/2019) 3,602 hours AirSense 10 CPAP Serial number [id number] Mode CPAP Set pressure 10 cmH2O EPR Off EPR level 1 Therapy Leaks - L/min 95th percentile: 43.6 Events per hour AHI: 1.1 ----- Message ----- From: Consuelo Pandy, PA-C Sent: 08/13/2019   7:34 PM EDT To: Freada Bergeron, CMA Subject: [facility] [place]! I had a telehealth visit with this pt today.  Was working remotely from home. Visit was primarily for OSA. May need compliance report sent to Dr. Radford Pax. She also was asking about obtaining some replacement parts for her CPAP. Can you f/u with her on this? Thanks.

## 2019-08-20 DIAGNOSIS — R94121 Abnormal vestibular function study: Secondary | ICD-10-CM | POA: Diagnosis not present

## 2019-08-20 DIAGNOSIS — H818X3 Other disorders of vestibular function, bilateral: Secondary | ICD-10-CM | POA: Diagnosis not present

## 2019-08-20 DIAGNOSIS — R42 Dizziness and giddiness: Secondary | ICD-10-CM | POA: Diagnosis not present

## 2019-08-27 DIAGNOSIS — R42 Dizziness and giddiness: Secondary | ICD-10-CM | POA: Diagnosis not present

## 2019-08-27 DIAGNOSIS — H818X3 Other disorders of vestibular function, bilateral: Secondary | ICD-10-CM | POA: Diagnosis not present

## 2019-08-27 DIAGNOSIS — R94121 Abnormal vestibular function study: Secondary | ICD-10-CM | POA: Diagnosis not present

## 2019-09-04 DIAGNOSIS — H818X3 Other disorders of vestibular function, bilateral: Secondary | ICD-10-CM | POA: Diagnosis not present

## 2019-09-04 DIAGNOSIS — R94121 Abnormal vestibular function study: Secondary | ICD-10-CM | POA: Diagnosis not present

## 2019-09-04 DIAGNOSIS — R42 Dizziness and giddiness: Secondary | ICD-10-CM | POA: Diagnosis not present

## 2019-09-17 DIAGNOSIS — H818X3 Other disorders of vestibular function, bilateral: Secondary | ICD-10-CM | POA: Diagnosis not present

## 2019-09-17 DIAGNOSIS — R42 Dizziness and giddiness: Secondary | ICD-10-CM | POA: Diagnosis not present

## 2019-09-17 DIAGNOSIS — R94121 Abnormal vestibular function study: Secondary | ICD-10-CM | POA: Diagnosis not present

## 2019-09-24 DIAGNOSIS — R42 Dizziness and giddiness: Secondary | ICD-10-CM | POA: Diagnosis not present

## 2019-09-24 DIAGNOSIS — H818X3 Other disorders of vestibular function, bilateral: Secondary | ICD-10-CM | POA: Diagnosis not present

## 2019-09-24 DIAGNOSIS — R94121 Abnormal vestibular function study: Secondary | ICD-10-CM | POA: Diagnosis not present

## 2019-10-01 DIAGNOSIS — H818X3 Other disorders of vestibular function, bilateral: Secondary | ICD-10-CM | POA: Diagnosis not present

## 2019-10-01 DIAGNOSIS — R42 Dizziness and giddiness: Secondary | ICD-10-CM | POA: Diagnosis not present

## 2019-10-01 DIAGNOSIS — R94121 Abnormal vestibular function study: Secondary | ICD-10-CM | POA: Diagnosis not present

## 2019-10-03 DIAGNOSIS — Z23 Encounter for immunization: Secondary | ICD-10-CM | POA: Diagnosis not present

## 2019-10-13 DIAGNOSIS — R42 Dizziness and giddiness: Secondary | ICD-10-CM | POA: Diagnosis not present

## 2019-10-13 DIAGNOSIS — R94121 Abnormal vestibular function study: Secondary | ICD-10-CM | POA: Diagnosis not present

## 2019-10-13 DIAGNOSIS — H818X3 Other disorders of vestibular function, bilateral: Secondary | ICD-10-CM | POA: Diagnosis not present

## 2019-10-15 DIAGNOSIS — H818X3 Other disorders of vestibular function, bilateral: Secondary | ICD-10-CM | POA: Diagnosis not present

## 2019-10-15 DIAGNOSIS — R94121 Abnormal vestibular function study: Secondary | ICD-10-CM | POA: Diagnosis not present

## 2019-10-15 DIAGNOSIS — R42 Dizziness and giddiness: Secondary | ICD-10-CM | POA: Diagnosis not present

## 2019-10-22 DIAGNOSIS — R42 Dizziness and giddiness: Secondary | ICD-10-CM | POA: Diagnosis not present

## 2019-10-22 DIAGNOSIS — H818X3 Other disorders of vestibular function, bilateral: Secondary | ICD-10-CM | POA: Diagnosis not present

## 2019-10-22 DIAGNOSIS — R94121 Abnormal vestibular function study: Secondary | ICD-10-CM | POA: Diagnosis not present

## 2019-11-04 DIAGNOSIS — R94121 Abnormal vestibular function study: Secondary | ICD-10-CM | POA: Diagnosis not present

## 2019-11-04 DIAGNOSIS — R42 Dizziness and giddiness: Secondary | ICD-10-CM | POA: Diagnosis not present

## 2019-11-04 DIAGNOSIS — H818X3 Other disorders of vestibular function, bilateral: Secondary | ICD-10-CM | POA: Diagnosis not present

## 2020-01-12 DIAGNOSIS — I10 Essential (primary) hypertension: Secondary | ICD-10-CM | POA: Diagnosis not present

## 2020-01-12 DIAGNOSIS — Z7189 Other specified counseling: Secondary | ICD-10-CM | POA: Diagnosis not present

## 2020-01-12 DIAGNOSIS — R2689 Other abnormalities of gait and mobility: Secondary | ICD-10-CM | POA: Diagnosis not present

## 2020-01-12 DIAGNOSIS — E7800 Pure hypercholesterolemia, unspecified: Secondary | ICD-10-CM | POA: Diagnosis not present

## 2020-01-12 DIAGNOSIS — E1169 Type 2 diabetes mellitus with other specified complication: Secondary | ICD-10-CM | POA: Diagnosis not present

## 2020-01-16 DIAGNOSIS — E1169 Type 2 diabetes mellitus with other specified complication: Secondary | ICD-10-CM | POA: Diagnosis not present

## 2020-01-27 DIAGNOSIS — Z1231 Encounter for screening mammogram for malignant neoplasm of breast: Secondary | ICD-10-CM | POA: Diagnosis not present

## 2020-04-05 DIAGNOSIS — M546 Pain in thoracic spine: Secondary | ICD-10-CM | POA: Diagnosis not present

## 2020-04-05 DIAGNOSIS — S40212A Abrasion of left shoulder, initial encounter: Secondary | ICD-10-CM | POA: Diagnosis not present

## 2020-04-05 DIAGNOSIS — H93233 Hyperacusis, bilateral: Secondary | ICD-10-CM | POA: Diagnosis not present

## 2020-04-05 DIAGNOSIS — H6121 Impacted cerumen, right ear: Secondary | ICD-10-CM | POA: Diagnosis not present

## 2020-07-14 DIAGNOSIS — F324 Major depressive disorder, single episode, in partial remission: Secondary | ICD-10-CM | POA: Diagnosis not present

## 2020-07-14 DIAGNOSIS — J301 Allergic rhinitis due to pollen: Secondary | ICD-10-CM | POA: Diagnosis not present

## 2020-07-14 DIAGNOSIS — G4733 Obstructive sleep apnea (adult) (pediatric): Secondary | ICD-10-CM | POA: Diagnosis not present

## 2020-07-14 DIAGNOSIS — I10 Essential (primary) hypertension: Secondary | ICD-10-CM | POA: Diagnosis not present

## 2020-07-14 DIAGNOSIS — Z1389 Encounter for screening for other disorder: Secondary | ICD-10-CM | POA: Diagnosis not present

## 2020-07-14 DIAGNOSIS — E669 Obesity, unspecified: Secondary | ICD-10-CM | POA: Diagnosis not present

## 2020-07-14 DIAGNOSIS — E1169 Type 2 diabetes mellitus with other specified complication: Secondary | ICD-10-CM | POA: Diagnosis not present

## 2020-07-14 DIAGNOSIS — Z0000 Encounter for general adult medical examination without abnormal findings: Secondary | ICD-10-CM | POA: Diagnosis not present

## 2020-07-14 DIAGNOSIS — E7800 Pure hypercholesterolemia, unspecified: Secondary | ICD-10-CM | POA: Diagnosis not present

## 2020-11-08 DIAGNOSIS — Z23 Encounter for immunization: Secondary | ICD-10-CM | POA: Diagnosis not present

## 2021-01-25 DIAGNOSIS — E1169 Type 2 diabetes mellitus with other specified complication: Secondary | ICD-10-CM | POA: Diagnosis not present

## 2021-01-25 DIAGNOSIS — I10 Essential (primary) hypertension: Secondary | ICD-10-CM | POA: Diagnosis not present

## 2021-01-25 DIAGNOSIS — E7800 Pure hypercholesterolemia, unspecified: Secondary | ICD-10-CM | POA: Diagnosis not present

## 2021-01-25 DIAGNOSIS — Z23 Encounter for immunization: Secondary | ICD-10-CM | POA: Diagnosis not present

## 2021-01-25 DIAGNOSIS — G4733 Obstructive sleep apnea (adult) (pediatric): Secondary | ICD-10-CM | POA: Diagnosis not present

## 2021-02-01 DIAGNOSIS — Z1231 Encounter for screening mammogram for malignant neoplasm of breast: Secondary | ICD-10-CM | POA: Diagnosis not present

## 2021-08-18 DIAGNOSIS — E1169 Type 2 diabetes mellitus with other specified complication: Secondary | ICD-10-CM | POA: Diagnosis not present

## 2021-08-18 DIAGNOSIS — G4733 Obstructive sleep apnea (adult) (pediatric): Secondary | ICD-10-CM | POA: Diagnosis not present

## 2021-08-18 DIAGNOSIS — F324 Major depressive disorder, single episode, in partial remission: Secondary | ICD-10-CM | POA: Diagnosis not present

## 2021-08-18 DIAGNOSIS — R197 Diarrhea, unspecified: Secondary | ICD-10-CM | POA: Diagnosis not present

## 2021-08-18 DIAGNOSIS — E7800 Pure hypercholesterolemia, unspecified: Secondary | ICD-10-CM | POA: Diagnosis not present

## 2021-08-18 DIAGNOSIS — Z1389 Encounter for screening for other disorder: Secondary | ICD-10-CM | POA: Diagnosis not present

## 2021-08-18 DIAGNOSIS — I10 Essential (primary) hypertension: Secondary | ICD-10-CM | POA: Diagnosis not present

## 2021-08-18 DIAGNOSIS — Z0000 Encounter for general adult medical examination without abnormal findings: Secondary | ICD-10-CM | POA: Diagnosis not present

## 2021-08-18 DIAGNOSIS — E669 Obesity, unspecified: Secondary | ICD-10-CM | POA: Diagnosis not present

## 2021-08-18 DIAGNOSIS — J301 Allergic rhinitis due to pollen: Secondary | ICD-10-CM | POA: Diagnosis not present

## 2021-10-14 DIAGNOSIS — Z23 Encounter for immunization: Secondary | ICD-10-CM | POA: Diagnosis not present

## 2022-01-27 ENCOUNTER — Other Ambulatory Visit: Payer: Self-pay | Admitting: Family Medicine

## 2022-01-27 DIAGNOSIS — Z1231 Encounter for screening mammogram for malignant neoplasm of breast: Secondary | ICD-10-CM

## 2022-03-03 ENCOUNTER — Ambulatory Visit
Admission: RE | Admit: 2022-03-03 | Discharge: 2022-03-03 | Disposition: A | Discharge status: Home / Self Care | Payer: Medicare Other | Source: Ambulatory Visit | Attending: Family Medicine | Admitting: Family Medicine

## 2022-03-03 ENCOUNTER — Other Ambulatory Visit: Payer: Self-pay

## 2022-03-03 DIAGNOSIS — Z1231 Encounter for screening mammogram for malignant neoplasm of breast: Secondary | ICD-10-CM | POA: Insufficient documentation

## 2022-03-13 ENCOUNTER — Inpatient Hospital Stay
Admission: RE | Admit: 2022-03-13 | Discharge: 2022-03-13 | Disposition: A | Discharge status: Home / Self Care | Payer: Self-pay | Source: Ambulatory Visit | Attending: *Deleted | Admitting: *Deleted

## 2022-03-13 ENCOUNTER — Other Ambulatory Visit: Payer: Self-pay | Admitting: *Deleted

## 2022-03-13 DIAGNOSIS — Z1231 Encounter for screening mammogram for malignant neoplasm of breast: Secondary | ICD-10-CM

## 2022-04-03 DIAGNOSIS — E7800 Pure hypercholesterolemia, unspecified: Secondary | ICD-10-CM | POA: Diagnosis not present

## 2022-04-03 DIAGNOSIS — I10 Essential (primary) hypertension: Secondary | ICD-10-CM | POA: Diagnosis not present

## 2022-04-03 DIAGNOSIS — G4733 Obstructive sleep apnea (adult) (pediatric): Secondary | ICD-10-CM | POA: Diagnosis not present

## 2022-04-03 DIAGNOSIS — E1169 Type 2 diabetes mellitus with other specified complication: Secondary | ICD-10-CM | POA: Diagnosis not present

## 2022-04-05 DIAGNOSIS — H52223 Regular astigmatism, bilateral: Secondary | ICD-10-CM | POA: Diagnosis not present

## 2022-04-05 DIAGNOSIS — H5203 Hypermetropia, bilateral: Secondary | ICD-10-CM | POA: Diagnosis not present

## 2022-04-05 DIAGNOSIS — H524 Presbyopia: Secondary | ICD-10-CM | POA: Diagnosis not present

## 2022-04-05 DIAGNOSIS — E119 Type 2 diabetes mellitus without complications: Secondary | ICD-10-CM | POA: Diagnosis not present

## 2022-04-05 DIAGNOSIS — Z7984 Long term (current) use of oral hypoglycemic drugs: Secondary | ICD-10-CM | POA: Diagnosis not present

## 2022-04-05 DIAGNOSIS — H2513 Age-related nuclear cataract, bilateral: Secondary | ICD-10-CM | POA: Diagnosis not present

## 2022-07-04 DIAGNOSIS — I10 Essential (primary) hypertension: Secondary | ICD-10-CM | POA: Diagnosis not present

## 2022-07-04 DIAGNOSIS — G629 Polyneuropathy, unspecified: Secondary | ICD-10-CM | POA: Diagnosis not present

## 2022-07-04 DIAGNOSIS — Z1389 Encounter for screening for other disorder: Secondary | ICD-10-CM | POA: Diagnosis not present

## 2022-07-04 DIAGNOSIS — Z780 Asymptomatic menopausal state: Secondary | ICD-10-CM | POA: Diagnosis not present

## 2022-07-04 DIAGNOSIS — E1169 Type 2 diabetes mellitus with other specified complication: Secondary | ICD-10-CM | POA: Diagnosis not present

## 2022-07-04 DIAGNOSIS — Z0000 Encounter for general adult medical examination without abnormal findings: Secondary | ICD-10-CM | POA: Diagnosis not present

## 2022-07-13 DIAGNOSIS — Z780 Asymptomatic menopausal state: Secondary | ICD-10-CM | POA: Diagnosis not present

## 2022-08-17 IMAGING — MG MM DIGITAL SCREENING BILAT W/ TOMO AND CAD
6 of 10 series · 6 of 30 positions shown · non-contrast
Comparison: Previous exam(s).

CLINICAL DATA: Screening.

EXAM:
DIGITAL SCREENING BILATERAL MAMMOGRAM WITH TOMOSYNTHESIS AND CAD
TECHNIQUE: Bilateral screening digital craniocaudal and mediolateral oblique
mammograms were obtained. Bilateral screening digital breast
tomosynthesis was performed. The images were evaluated with
computer-aided detection.

[L CC synth-2D]
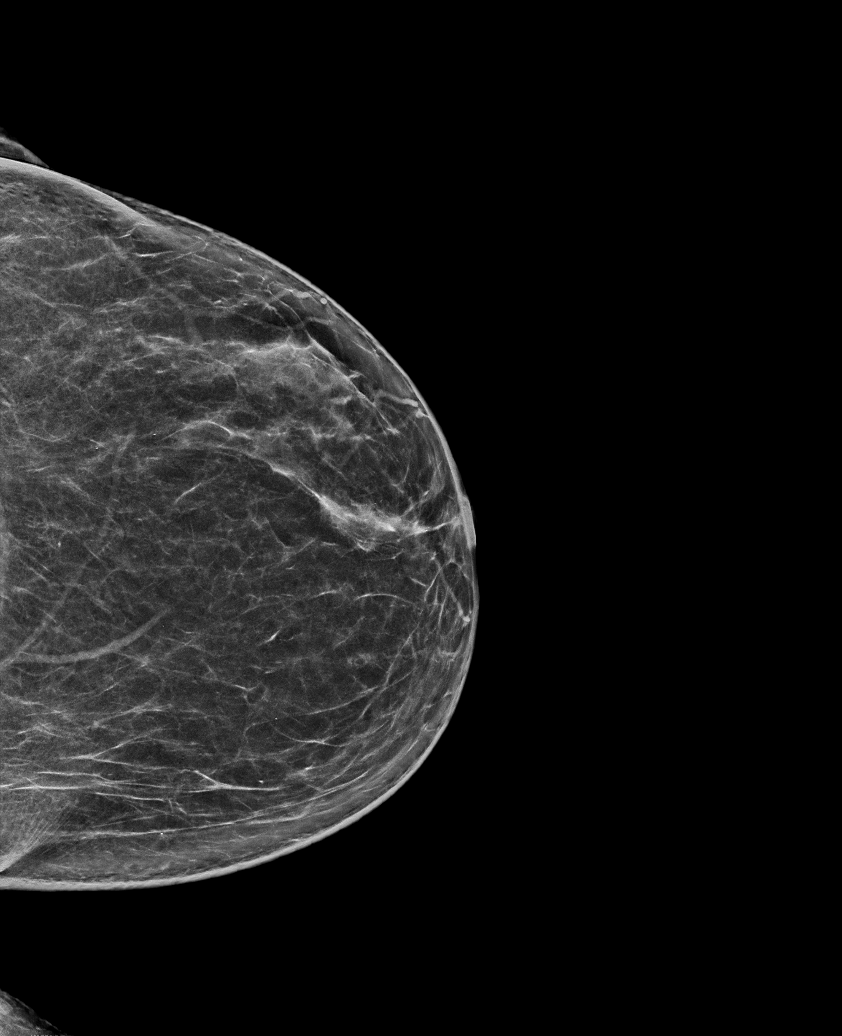

[R MLO synth-2D]
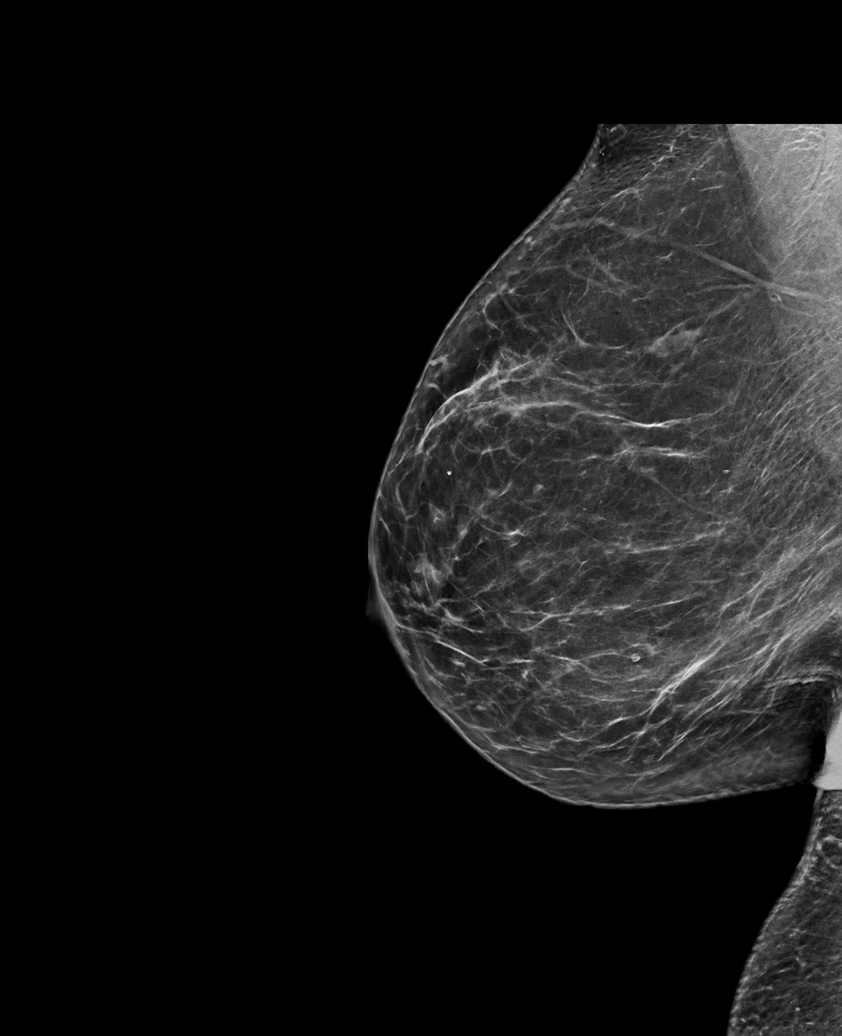

[L MLO synth-2D (1 of 2)]
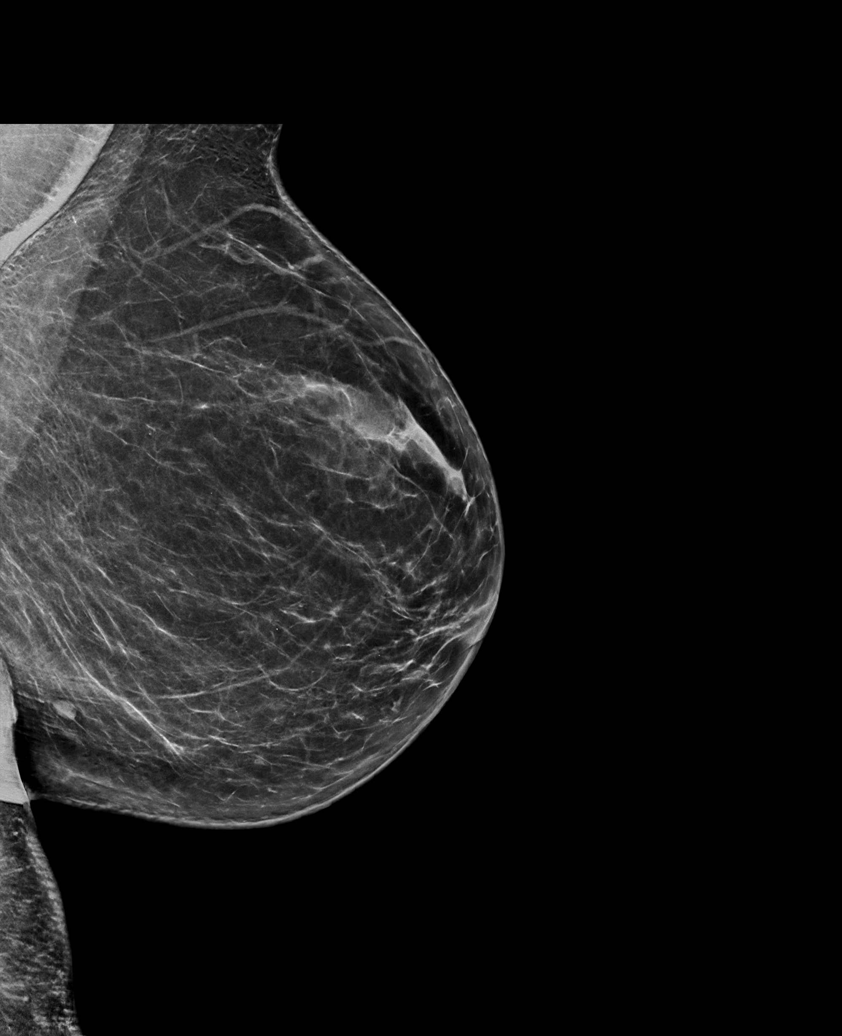

[L MLO synth-2D (2 of 2)]
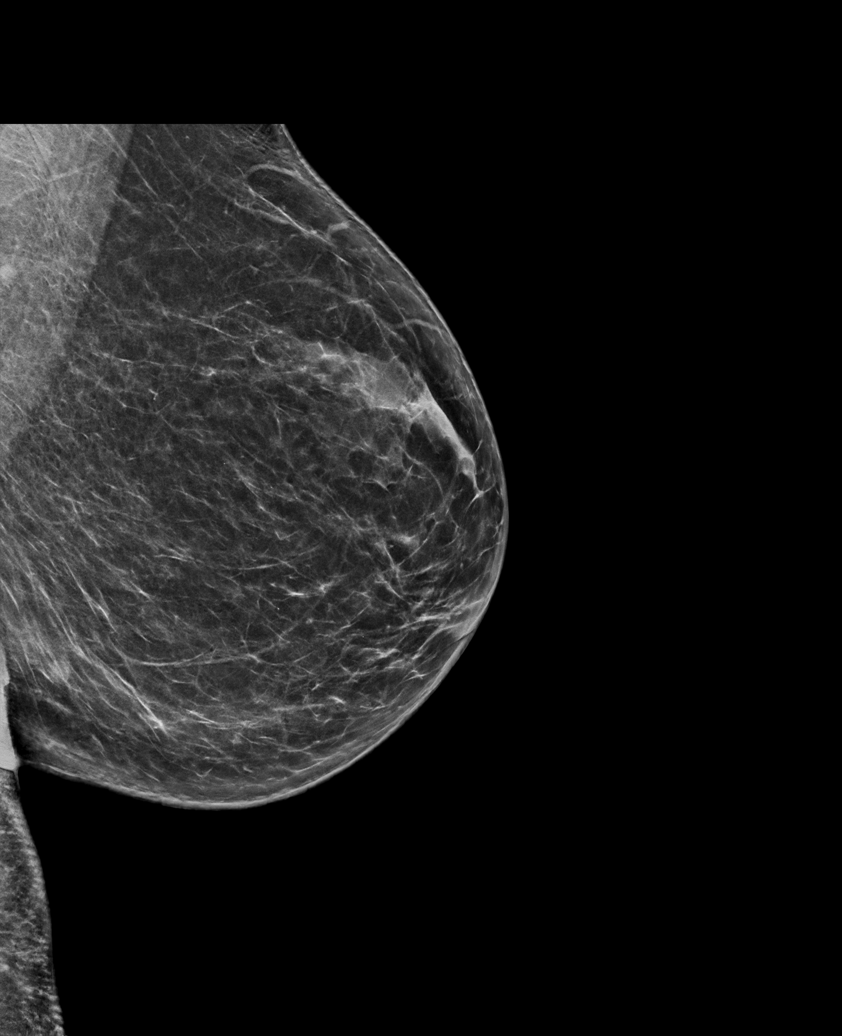

[R CC synth-2D]
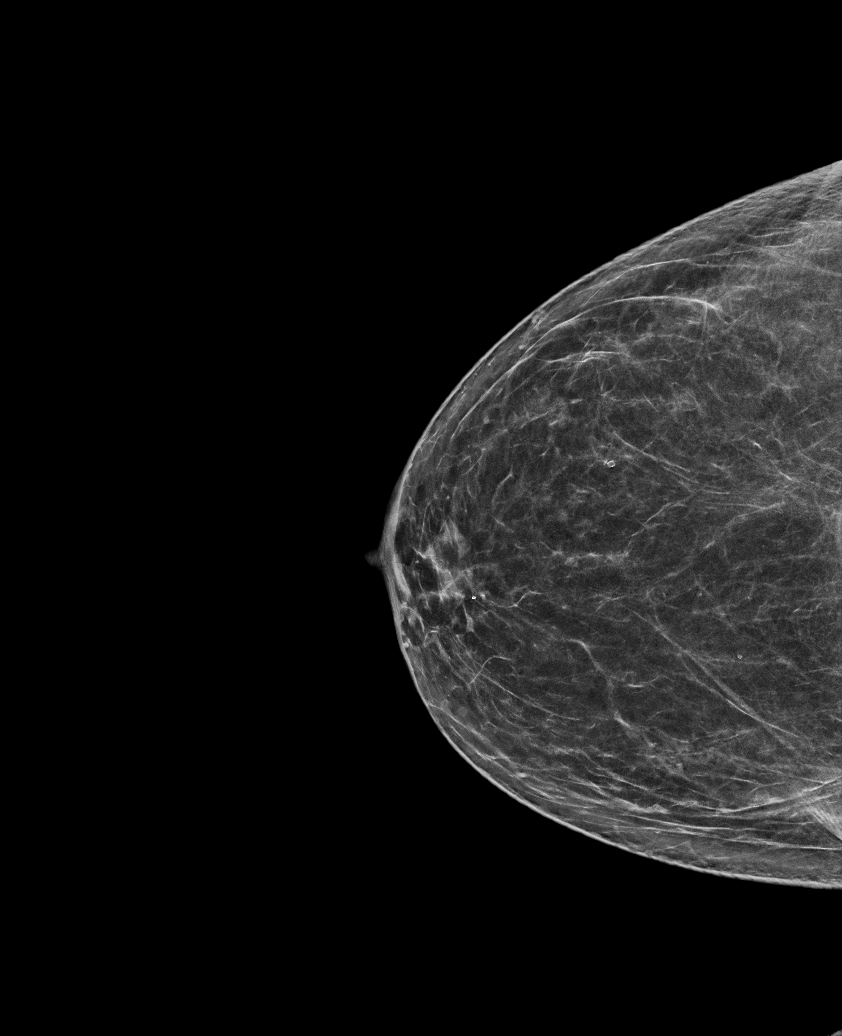

[L MLO tomo · tomo slice 38/75.0]
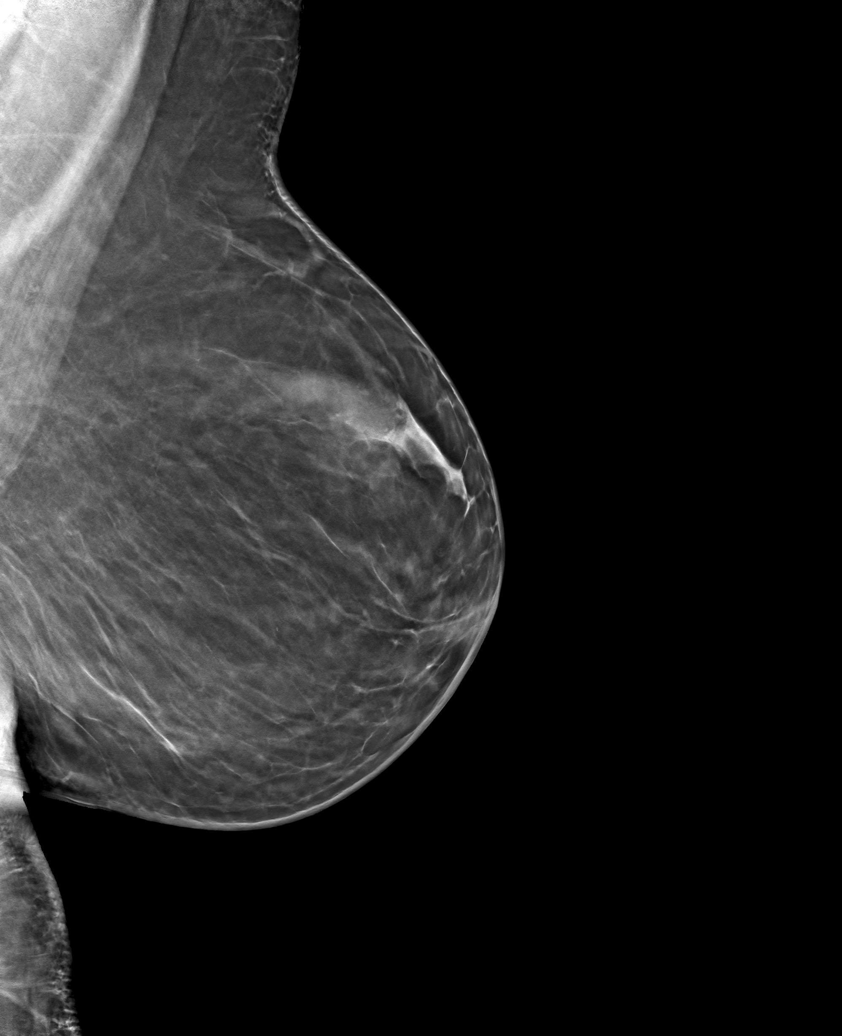

[6 of 30 positions shown; findings below may reference images not displayed]

ACR Breast Density Category b: There are scattered areas of
fibroglandular density.
FINDINGS: There are no findings suspicious for malignancy.
IMPRESSION: No mammographic evidence of malignancy. A result letter of this
screening mammogram will be mailed directly to the patient.

RECOMMENDATION:
Screening mammogram in one year. (Code:51-O-LD2)

BI-RADS CATEGORY  1: Negative.

## 2023-04-16 ENCOUNTER — Other Ambulatory Visit: Payer: Self-pay | Admitting: Internal Medicine

## 2023-04-16 ENCOUNTER — Encounter: Payer: Self-pay | Admitting: Internal Medicine

## 2023-04-16 DIAGNOSIS — Z1231 Encounter for screening mammogram for malignant neoplasm of breast: Secondary | ICD-10-CM

## 2023-04-30 ENCOUNTER — Ambulatory Visit
Admission: RE | Admit: 2023-04-30 | Discharge: 2023-04-30 | Disposition: A | Discharge status: Home / Self Care | Payer: Medicare HMO | Source: Ambulatory Visit | Attending: Internal Medicine | Admitting: Internal Medicine

## 2023-04-30 DIAGNOSIS — Z1231 Encounter for screening mammogram for malignant neoplasm of breast: Secondary | ICD-10-CM | POA: Diagnosis not present

## 2023-07-16 LAB — EXTERNAL GENERIC LAB PROCEDURE

## 2023-07-30 LAB — EXTERNAL GENERIC LAB PROCEDURE: COLOGUARD: NEGATIVE

## 2024-01-03 DIAGNOSIS — N182 Chronic kidney disease, stage 2 (mild): Secondary | ICD-10-CM | POA: Diagnosis not present

## 2024-01-03 DIAGNOSIS — E1169 Type 2 diabetes mellitus with other specified complication: Secondary | ICD-10-CM | POA: Diagnosis not present

## 2024-01-10 DIAGNOSIS — N182 Chronic kidney disease, stage 2 (mild): Secondary | ICD-10-CM | POA: Diagnosis not present

## 2024-01-10 DIAGNOSIS — I7090 Unspecified atherosclerosis: Secondary | ICD-10-CM | POA: Diagnosis not present

## 2024-01-10 DIAGNOSIS — R2689 Other abnormalities of gait and mobility: Secondary | ICD-10-CM | POA: Diagnosis not present

## 2024-01-10 DIAGNOSIS — E7800 Pure hypercholesterolemia, unspecified: Secondary | ICD-10-CM | POA: Diagnosis not present

## 2024-01-10 DIAGNOSIS — I10 Essential (primary) hypertension: Secondary | ICD-10-CM | POA: Diagnosis not present

## 2024-01-10 DIAGNOSIS — E1169 Type 2 diabetes mellitus with other specified complication: Secondary | ICD-10-CM | POA: Diagnosis not present

## 2024-01-10 DIAGNOSIS — N1831 Chronic kidney disease, stage 3a: Secondary | ICD-10-CM | POA: Diagnosis not present

## 2024-01-16 ENCOUNTER — Other Ambulatory Visit: Payer: Self-pay | Admitting: Internal Medicine

## 2024-01-16 DIAGNOSIS — E1169 Type 2 diabetes mellitus with other specified complication: Secondary | ICD-10-CM

## 2024-01-16 DIAGNOSIS — I7090 Unspecified atherosclerosis: Secondary | ICD-10-CM

## 2024-01-21 ENCOUNTER — Ambulatory Visit
Admission: RE | Admit: 2024-01-21 | Discharge: 2024-01-21 | Disposition: A | Discharge status: Home / Self Care | Payer: Self-pay | Source: Ambulatory Visit | Attending: Internal Medicine | Admitting: Internal Medicine

## 2024-01-21 DIAGNOSIS — I10 Essential (primary) hypertension: Secondary | ICD-10-CM | POA: Diagnosis not present

## 2024-01-21 DIAGNOSIS — E1169 Type 2 diabetes mellitus with other specified complication: Secondary | ICD-10-CM | POA: Insufficient documentation

## 2024-01-21 DIAGNOSIS — N182 Chronic kidney disease, stage 2 (mild): Secondary | ICD-10-CM | POA: Diagnosis not present

## 2024-01-21 DIAGNOSIS — I7090 Unspecified atherosclerosis: Secondary | ICD-10-CM | POA: Insufficient documentation

## 2024-01-21 DIAGNOSIS — R0602 Shortness of breath: Secondary | ICD-10-CM | POA: Diagnosis not present

## 2024-01-21 DIAGNOSIS — G4733 Obstructive sleep apnea (adult) (pediatric): Secondary | ICD-10-CM | POA: Diagnosis not present

## 2024-01-21 DIAGNOSIS — E7800 Pure hypercholesterolemia, unspecified: Secondary | ICD-10-CM | POA: Diagnosis not present

## 2024-02-06 DIAGNOSIS — R0602 Shortness of breath: Secondary | ICD-10-CM | POA: Diagnosis not present

## 2024-02-19 DIAGNOSIS — E7800 Pure hypercholesterolemia, unspecified: Secondary | ICD-10-CM | POA: Diagnosis not present

## 2024-02-19 DIAGNOSIS — G4733 Obstructive sleep apnea (adult) (pediatric): Secondary | ICD-10-CM | POA: Diagnosis not present

## 2024-02-19 DIAGNOSIS — I10 Essential (primary) hypertension: Secondary | ICD-10-CM | POA: Diagnosis not present

## 2024-02-19 DIAGNOSIS — N182 Chronic kidney disease, stage 2 (mild): Secondary | ICD-10-CM | POA: Diagnosis not present

## 2024-02-19 DIAGNOSIS — E1169 Type 2 diabetes mellitus with other specified complication: Secondary | ICD-10-CM | POA: Diagnosis not present

## 2024-03-21 ENCOUNTER — Other Ambulatory Visit: Payer: Self-pay | Admitting: Internal Medicine

## 2024-03-21 DIAGNOSIS — Z1231 Encounter for screening mammogram for malignant neoplasm of breast: Secondary | ICD-10-CM

## 2024-04-07 DIAGNOSIS — E1169 Type 2 diabetes mellitus with other specified complication: Secondary | ICD-10-CM | POA: Diagnosis not present

## 2024-04-14 DIAGNOSIS — E1169 Type 2 diabetes mellitus with other specified complication: Secondary | ICD-10-CM | POA: Diagnosis not present

## 2024-04-14 DIAGNOSIS — N1831 Chronic kidney disease, stage 3a: Secondary | ICD-10-CM | POA: Diagnosis not present

## 2024-04-14 DIAGNOSIS — I10 Essential (primary) hypertension: Secondary | ICD-10-CM | POA: Diagnosis not present

## 2024-04-14 DIAGNOSIS — E7800 Pure hypercholesterolemia, unspecified: Secondary | ICD-10-CM | POA: Diagnosis not present

## 2024-04-14 DIAGNOSIS — F324 Major depressive disorder, single episode, in partial remission: Secondary | ICD-10-CM | POA: Diagnosis not present

## 2024-04-30 ENCOUNTER — Ambulatory Visit
Admission: RE | Admit: 2024-04-30 | Discharge: 2024-04-30 | Disposition: A | Discharge status: Home / Self Care | Payer: Self-pay | Source: Ambulatory Visit | Attending: Internal Medicine | Admitting: Internal Medicine

## 2024-04-30 DIAGNOSIS — Z1231 Encounter for screening mammogram for malignant neoplasm of breast: Secondary | ICD-10-CM | POA: Diagnosis not present

## 2024-07-14 DIAGNOSIS — E1169 Type 2 diabetes mellitus with other specified complication: Secondary | ICD-10-CM | POA: Diagnosis not present

## 2024-07-21 DIAGNOSIS — E7800 Pure hypercholesterolemia, unspecified: Secondary | ICD-10-CM | POA: Diagnosis not present

## 2024-07-21 DIAGNOSIS — I10 Essential (primary) hypertension: Secondary | ICD-10-CM | POA: Diagnosis not present

## 2024-07-21 DIAGNOSIS — N1831 Chronic kidney disease, stage 3a: Secondary | ICD-10-CM | POA: Diagnosis not present

## 2024-07-21 DIAGNOSIS — E1169 Type 2 diabetes mellitus with other specified complication: Secondary | ICD-10-CM | POA: Diagnosis not present

## 2024-07-24 DIAGNOSIS — Z7984 Long term (current) use of oral hypoglycemic drugs: Secondary | ICD-10-CM | POA: Diagnosis not present

## 2024-07-24 DIAGNOSIS — H04123 Dry eye syndrome of bilateral lacrimal glands: Secondary | ICD-10-CM | POA: Diagnosis not present

## 2024-07-24 DIAGNOSIS — E119 Type 2 diabetes mellitus without complications: Secondary | ICD-10-CM | POA: Diagnosis not present

## 2024-07-24 DIAGNOSIS — H2513 Age-related nuclear cataract, bilateral: Secondary | ICD-10-CM | POA: Diagnosis not present

## 2024-07-24 DIAGNOSIS — H5213 Myopia, bilateral: Secondary | ICD-10-CM | POA: Diagnosis not present

## 2024-07-24 DIAGNOSIS — H0289 Other specified disorders of eyelid: Secondary | ICD-10-CM | POA: Diagnosis not present

## 2024-07-24 DIAGNOSIS — H52223 Regular astigmatism, bilateral: Secondary | ICD-10-CM | POA: Diagnosis not present

## 2024-07-24 DIAGNOSIS — H524 Presbyopia: Secondary | ICD-10-CM | POA: Diagnosis not present

## 2024-09-09 DIAGNOSIS — E7800 Pure hypercholesterolemia, unspecified: Secondary | ICD-10-CM | POA: Diagnosis not present

## 2024-09-09 DIAGNOSIS — E1169 Type 2 diabetes mellitus with other specified complication: Secondary | ICD-10-CM | POA: Diagnosis not present

## 2024-09-09 DIAGNOSIS — R0602 Shortness of breath: Secondary | ICD-10-CM | POA: Diagnosis not present

## 2024-09-09 DIAGNOSIS — I10 Essential (primary) hypertension: Secondary | ICD-10-CM | POA: Diagnosis not present

## 2024-09-09 DIAGNOSIS — G4733 Obstructive sleep apnea (adult) (pediatric): Secondary | ICD-10-CM | POA: Diagnosis not present
